# Patient Record
Sex: Female | Born: 1995 | Hispanic: Yes | Marital: Single | Smoking: Former smoker
Health system: Southern US, Community
[De-identification: ages and names within clinical notes are randomized; demographics above are authoritative.]

## PROBLEM LIST (undated history)

## (undated) HISTORY — PX: NO PAST SURGERIES: SHX2092

---

## 2014-10-24 ENCOUNTER — Emergency Department
Admit: 2014-10-24 | Disposition: A | Discharge status: Home / Self Care | Payer: Self-pay | Admitting: Physician Assistant

## 2014-10-25 LAB — BETA STREP CULTURE(ARMC)

## 2015-07-10 NOTE — L&D Delivery Note (Signed)
Delivery Note Patient's last menstrual period was 07/28/2015 (within days).  EDC by US: 06/09/16 EGA: 33.0  At 11:35 AM a viable female was delivered via Vaginal, Spontaneous Delivery (Presentation: direct OP).  APGAR: 7, 8; weight 4 lb 11.1 oz (2129 g).   Placenta status: spontaneous, intact, to pathology.  Cord: 3V, nuchal x1, loose  with the following complications: none.  Cord pH: declined by neonatologist  Anesthesia:  epidural Episiotomy:  none Lacerations:  none Suture Repair: none Est. Blood Loss (mL):  300cc  Mom to postpartum.  Baby to Couplet care / Skin to Skin.  Mom presented with complaints of contractions and was found to be preeclamptic, with elevated blood pressures and protienuria >3g estimated.  She was found to be 3cm with a bulging bag, and had oligohydramnios with an AFI of 1cm.  She was admitted in labor and attempts were made for tocolysis after betamethasone administration x1.  Magnesium sulfate for neuroprotection, and ampicillin for GBS prophylaxis were also administered.  She progressed to complete with a bulging bag of water.  2u of insulin were administered with goal to get blood sugar to <110.  AROM for scant clear fluid was performed, and soon after we began pushing.  2nd stage was <1830mins.  Baby head delivered, nuchal cord reduced, and then body delivered.  The baby was held below the level of the placenta and delayed cord clamping was performed for 60 seconds.  The cord was then doubly clamped and cut by FOB.  Baby was handed off to the neonatologist.  Cord blood was collected.  Placenta delivered spontaneously, intact.  There were no abrasions or lacerations.    For a brief time, baby was placed on mom for skin-to-skin, and we sang happy birthday to this as-of-yet unnamed baby boy.  Jarian Longoria C Westyn Keatley 04/21/2016, 11:59 AM

## 2015-10-26 ENCOUNTER — Emergency Department
Admission: EM | Admit: 2015-10-26 | Discharge: 2015-10-26 | Disposition: A | Discharge status: Home / Self Care | Payer: Self-pay | Attending: Emergency Medicine | Admitting: Emergency Medicine

## 2015-10-26 ENCOUNTER — Emergency Department: Payer: Self-pay

## 2015-10-26 ENCOUNTER — Encounter: Payer: Self-pay | Admitting: Emergency Medicine

## 2015-10-26 DIAGNOSIS — F17210 Nicotine dependence, cigarettes, uncomplicated: Secondary | ICD-10-CM | POA: Insufficient documentation

## 2015-10-26 DIAGNOSIS — O200 Threatened abortion: Secondary | ICD-10-CM | POA: Insufficient documentation

## 2015-10-26 DIAGNOSIS — Z3A12 12 weeks gestation of pregnancy: Secondary | ICD-10-CM | POA: Insufficient documentation

## 2015-10-26 DIAGNOSIS — O99331 Smoking (tobacco) complicating pregnancy, first trimester: Secondary | ICD-10-CM | POA: Insufficient documentation

## 2015-10-26 DIAGNOSIS — R109 Unspecified abdominal pain: Secondary | ICD-10-CM

## 2015-10-26 DIAGNOSIS — O26899 Other specified pregnancy related conditions, unspecified trimester: Secondary | ICD-10-CM

## 2015-10-26 DIAGNOSIS — R1032 Left lower quadrant pain: Secondary | ICD-10-CM | POA: Insufficient documentation

## 2015-10-26 LAB — URINALYSIS COMPLETE WITH MICROSCOPIC (ARMC ONLY)
BACTERIA UA: NONE SEEN
Bilirubin Urine: NEGATIVE
Glucose, UA: NEGATIVE mg/dL
HGB URINE DIPSTICK: NEGATIVE
Ketones, ur: NEGATIVE mg/dL
NITRITE: NEGATIVE
PROTEIN: NEGATIVE mg/dL
Specific Gravity, Urine: 1.003 — ABNORMAL LOW (ref 1.005–1.030)
pH: 8 (ref 5.0–8.0)

## 2015-10-26 LAB — ABO/RH: ABO/RH(D): O POS

## 2015-10-26 LAB — COMPREHENSIVE METABOLIC PANEL WITH GFR
ALT: 28 U/L (ref 14–54)
AST: 27 U/L (ref 15–41)
Albumin: 4.7 g/dL (ref 3.5–5.0)
Alkaline Phosphatase: 80 U/L (ref 38–126)
Anion gap: 7 (ref 5–15)
BUN: 6 mg/dL (ref 6–20)
CO2: 25 mmol/L (ref 22–32)
Calcium: 9.6 mg/dL (ref 8.9–10.3)
Chloride: 104 mmol/L (ref 101–111)
Creatinine, Ser: 0.51 mg/dL (ref 0.44–1.00)
GFR calc Af Amer: >60 mL/min
GFR calc non Af Amer: >60 mL/min
Glucose, Bld: 75 mg/dL (ref 65–99)
Potassium: 3.5 mmol/L (ref 3.5–5.1)
Sodium: 136 mmol/L (ref 135–145)
Total Bilirubin: 0.6 mg/dL (ref 0.3–1.2)
Total Protein: 7.8 g/dL (ref 6.5–8.1)

## 2015-10-26 LAB — WET PREP, GENITAL
Clue Cells Wet Prep HPF POC: NONE SEEN
Sperm: NONE SEEN
Trich, Wet Prep: NONE SEEN
Yeast Wet Prep HPF POC: NONE SEEN

## 2015-10-26 LAB — CBC
HEMATOCRIT: 39.1 % (ref 35.0–47.0)
Hemoglobin: 13.6 g/dL (ref 12.0–16.0)
MCH: 31.3 pg (ref 26.0–34.0)
MCHC: 34.8 g/dL (ref 32.0–36.0)
MCV: 90.1 fL (ref 80.0–100.0)
Platelets: 289 10*3/uL (ref 150–440)
RBC: 4.34 MIL/uL (ref 3.80–5.20)
RDW: 12.7 % (ref 11.5–14.5)
WBC: 8.1 10*3/uL (ref 3.6–11.0)

## 2015-10-26 LAB — LIPASE, BLOOD: Lipase: 21 U/L (ref 11–51)

## 2015-10-26 LAB — CHLAMYDIA/NGC RT PCR (ARMC ONLY)
CHLAMYDIA TR: DETECTED — AB
N gonorrhoeae: NOT DETECTED

## 2015-10-26 LAB — POCT PREGNANCY, URINE: PREG TEST UR: POSITIVE — AB

## 2015-10-26 LAB — TYPE AND SCREEN
ABO/RH(D): O POS
ANTIBODY SCREEN: NEGATIVE

## 2015-10-26 LAB — HCG, QUANTITATIVE, PREGNANCY: hCG, Beta Chain, Quant, S: 93272 m[IU]/mL — ABNORMAL HIGH (ref <5)

## 2015-10-26 LAB — OB RESULTS CONSOLE GC/CHLAMYDIA
CHLAMYDIA, DNA PROBE: POSITIVE
Gonorrhea: NEGATIVE

## 2015-10-26 NOTE — ED Provider Notes (Addendum)
[facility] Emergency Department Provider Note     Time seen: ----------------------------------------- 11:30 AM on 10/26/2015 -----------------------------------------    I have reviewed the triage vital signs and the nursing notes.   HISTORY  Chief Complaint Abdominal Pain and Ectopic Pregnancy    HPI Tara Patterson is a 20 y.o. female who presents to ER for lower abdominal pain and pregnancy. Patient was just seen [facility] the [facility] and sent here for further evaluation as they were concern for ectopic pregnancy. Patient states is her first pregnancy, she's not had any bleeding, pain is starting acutely in the left lower quadrant. He made her pain better or worse.   History reviewed. No pertinent past medical history.  There are no active problems to display for this patient.   History reviewed. No pertinent past surgical history.  Allergies Review of patient's allergies indicates no known allergies.  Social History Social History  Substance Use Topics  . Smoking status: Current Some Day Smoker    Types: Cigarettes  . Smokeless tobacco: None  . Alcohol Use: Yes     Comment: weekly    Review of Systems Constitutional: Negative for fever. Eyes: Negative for visual changes. ENT: Negative for sore throat. Cardiovascular: Negative for chest pain. Respiratory: Negative for shortness of breath. Gastrointestinal: Positive for abdominal pain Genitourinary: Negative for dysuria. Musculoskeletal: Negative for back pain. Skin: Negative for rash. Neurological: Negative for headaches, focal weakness or numbness.  10-point ROS otherwise negative.  ____________________________________________   PHYSICAL EXAM:  VITAL SIGNS: ED Triage Vitals  Enc Vitals Group     BP 10/26/15 1105 130/93 mmHg     Pulse Rate 10/26/15 1105 74     Resp 10/26/15 1105 18     Temp 10/26/15 1105 99.2 F (37.3 C)     Temp Source 10/26/15 1105 Oral      SpO2 10/26/15 1105 100 %     Weight 10/26/15 1105 165 lb (74.844 kg)     Height 10/26/15 1105  (1.676 m)     Head Cir --      Peak Flow --      Pain Score 10/26/15 1106 8     Pain Loc --      Pain Edu? --      Excl. in GC? --     Constitutional: Alert and oriented. Well appearing and in no distress. Eyes: Conjunctivae are normal. PERRL. Normal extraocular movements. ENT   Head: Normocephalic and atraumatic.   Nose: No congestion/rhinnorhea.   Mouth/Throat: Mucous membranes are moist.   Neck: No stridor. Cardiovascular: Normal rate, regular rhythm. No murmurs, rubs, or gallops. Respiratory: Normal respiratory effort without tachypnea nor retractions. Breath sounds are clear and equal bilaterally. No wheezes/rales/rhonchi. Gastrointestinal: Mild left lower quadrant tenderness, no rebound or guarding. Normal bowel sounds. Genitourinary: Is unremarkable, no bleeding or discharge. Cervix is closed Musculoskeletal: Nontender with normal range of motion in all extremities. No lower extremity tenderness nor edema. Neurologic:  Normal speech and language. No gross focal neurologic deficits are appreciated.  Skin:  Skin is warm, dry and intact. No rash noted. Psychiatric: Mood and affect are normal. Speech and behavior are normal.  ____________________________________________  ED COURSE:  Pertinent labs & imaging results that were available during my care of the patient were reviewed by me and considered in my medical decision making (see chart for details). Patient is in no acute distress, will check basic labs, ultrasound imaging ____________________________________________    LABS (pertinent positives/negatives)  Labs Reviewed  HCG, QUANTITATIVE, PREGNANCY - Abnormal; Notable for the following:    hCG, Beta Chain, Quant, S 93272 (*)    All other components within normal limits  URINALYSIS COMPLETEWITH MICROSCOPIC ([facility] ONLY) - Abnormal; Notable for the  following:    Color, Urine COLORLESS (*)    APPearance CLEAR (*)    Specific Gravity, Urine 1.003 (*)    Leukocytes, UA TRACE (*)    Squamous Epithelial / LPF 0-5 (*)    All other components within normal limits  POCT PREGNANCY, URINE - Abnormal; Notable for the following:    Preg Test, Ur POSITIVE (*)    All other components within normal limits  WET PREP, GENITAL  CHLAMYDIA/NGC RT PCR ([facility] ONLY)  LIPASE, BLOOD  COMPREHENSIVE METABOLIC PANEL  CBC  POC URINE PREG, ED  ABO/RH  TYPE AND SCREEN    RADIOLOGY  Pregnancy ultrasound  IMPRESSION: Single living IUP demonstrated. No acute maternal findings visualized. ____________________________________________  FINAL ASSESSMENT AND PLAN  Abdominal pain in pregnancy  Plan: Patient with labs and imaging as dictated above. Patient with reassuring labs and ultrasound. She'll be discharged with OB/GYN referral. No clear etiology for her pain at this time.   Emily FilbertWilliams, Czar Ysaguirre E, MD   Emily FilbertJonathan E Brandey Vandalen, MD 10/26/15 1425  Emily FilbertJonathan E Elford Evilsizer, MD 10/26/15 1432  Emily FilbertJonathan E Donna Snooks, MD 10/26/15 [phone]

## 2015-10-26 NOTE — Discharge Instructions (Signed)

## 2015-10-27 ENCOUNTER — Telehealth: Payer: Self-pay | Admitting: Emergency Medicine

## 2015-10-27 NOTE — ED Notes (Signed)
Called pt to inform of positive chlamydia test and was not treated.  Asked her to call me back.

## 2015-12-09 LAB — OB RESULTS CONSOLE HIV ANTIBODY (ROUTINE TESTING): HIV: NONREACTIVE

## 2015-12-09 LAB — OB RESULTS CONSOLE RPR: RPR: NONREACTIVE

## 2015-12-09 LAB — OB RESULTS CONSOLE HEPATITIS B SURFACE ANTIGEN: Hepatitis B Surface Ag: NEGATIVE

## 2016-01-05 ENCOUNTER — Other Ambulatory Visit: Payer: Self-pay | Admitting: Physician Assistant

## 2016-01-05 DIAGNOSIS — Z3689 Encounter for other specified antenatal screening: Secondary | ICD-10-CM

## 2016-01-06 ENCOUNTER — Ambulatory Visit: Payer: Self-pay

## 2016-01-07 LAB — OB RESULTS CONSOLE GC/CHLAMYDIA
Chlamydia: NEGATIVE
GC PROBE AMP, GENITAL: NEGATIVE

## 2016-01-13 ENCOUNTER — Ambulatory Visit
Admission: RE | Admit: 2016-01-13 | Discharge: 2016-01-13 | Disposition: A | Discharge status: Home / Self Care | Payer: Self-pay | Source: Ambulatory Visit | Attending: Physician Assistant | Admitting: Physician Assistant

## 2016-01-13 DIAGNOSIS — Z3689 Encounter for other specified antenatal screening: Secondary | ICD-10-CM

## 2016-01-13 DIAGNOSIS — Z3A19 19 weeks gestation of pregnancy: Secondary | ICD-10-CM | POA: Insufficient documentation

## 2016-01-13 DIAGNOSIS — O0932 Supervision of pregnancy with insufficient antenatal care, second trimester: Secondary | ICD-10-CM | POA: Insufficient documentation

## 2016-03-17 LAB — OB RESULTS CONSOLE RPR: RPR: NONREACTIVE

## 2016-03-26 ENCOUNTER — Encounter: Payer: Self-pay | Admitting: Dietician

## 2016-03-26 ENCOUNTER — Encounter: Payer: Self-pay | Attending: Advanced Practice Midwife | Admitting: Dietician

## 2016-03-26 VITALS — BP 116/70 | Ht 66.0 in | Wt 186.6 lb

## 2016-03-26 DIAGNOSIS — Z3A00 Weeks of gestation of pregnancy not specified: Secondary | ICD-10-CM | POA: Insufficient documentation

## 2016-03-26 DIAGNOSIS — O24410 Gestational diabetes mellitus in pregnancy, diet controlled: Secondary | ICD-10-CM

## 2016-03-26 DIAGNOSIS — O24419 Gestational diabetes mellitus in pregnancy, unspecified control: Secondary | ICD-10-CM | POA: Insufficient documentation

## 2016-03-26 DIAGNOSIS — Z713 Dietary counseling and surveillance: Secondary | ICD-10-CM | POA: Insufficient documentation

## 2016-03-26 NOTE — Progress Notes (Signed)
Appt. Start Time: 1330 Appt. End Time: 1500  GDM Class 1 Diabetes Overview - define DM; state own type of DM; identify functions of pancreas and insulin; define insulin deficiency vs insulin resistance  Psychosocial - identify DM as a source of stress; state the effects of stress on BG control; verbalize appropriate stress management techniques; identify personal stress issues   Nutritional Management - describe effects of food on blood glucose; identify sources of carbohydrate, protein and fat; verbalize the importance of balance meals in controlling blood glucose; identify meals as well balanced or not; estimate servings of carbohydrate from menus; use food labels to identify servings size, content of carbohydrate, fiber, protein, fat, saturated fat and sodium; recognize food sources of fat, saturated fat, trans fat, sodium and verbalize goals for intake; describe healthful appropriate food choices when dining out   Exercise - describe the effects of exercise on blood glucose and importance of regular exercise in controlling diabetes; state a plan for personal exercise; verbalize contraindications for exercise  Medications - state name, dose, timing of currently prescribed medications; describe types of medications available for diabetes  Insulin Training - prepare and administer insulin accurately; state correct rotation pattern; verbalize safe and lawful needle disposal  Self-Monitoring - state importance of HBGM and demo procedure accurately; use HBGM results to effectively manage diabetes; identify importance of regular HbA1C testing and goals for results  Acute Complications/Sick Day Guidelines - recognize hyperglycemia and hypoglycemia with causes and effects; identify blood glucose results as high, low or in control; list steps in treating and preventing high and low blood glucose; state appropriate measure to manage blood glucose when ill (need for meds, HBGM plan, when to call physician,  need for fluids)  Chronic Complications/Foot, Skin, Eye Dental Care - identify possible long-term complications of diabetes (retinopathy, neuropathy, nephropathy, cardiovascular disease, infections); explain steps in prevention and treatment of chronic complications; state importance of daily self-foot exams; describe how to examine feet and what to look for; explain appropriate eye and dental care  Lifestyle Changes/Goals & Health/Community Resources - state benefits of making appropriate lifestyle changes; identify habits that need to change (meals, tobacco, alcohol); identify strategies to reduce risk factors for personal health; set goals for proper diabetes care; state need for and frequency of healthcare follow-up; describe appropriate community resources for good health (ADA, web sites, apps)   Pregnancy/Sexual Health - define gestational diabetes; state importance of good blood glucose control and birth control prior to pregnancy; state importance of good blood glucose control in preventing sexual problems (impotence, vaginal dryness, infections, loss of desire); state relationship of blood glucose control and pregnancy outcome; describe risk of maternal and fetal complications  Teaching Materials Used: Meter-True Result meter General Meal Planning Guidelines Daily Food Record Gestational Diabetes Booklet Gestational Video Goals for Healthy Pregnancy

## 2016-03-26 NOTE — Patient Instructions (Signed)
Read booklet on Gestational Diabetes Follow Gestational Meal Planning Guidelines Drink 6-7 glasses of water/day Complete a 3 Day Food Record and bring to next appointment Check blood sugars 4 x day - before breakfast and 2 hrs after every meal and record  Bring blood sugar log to all appointments Walk 20-30 minutes at least 5 x week if permitted by MD Call if questions arise [phone] Next appointment    04-02-16

## 2016-04-02 ENCOUNTER — Encounter: Payer: Self-pay | Admitting: Dietician

## 2016-04-02 VITALS — BP 120/76 | Ht 66.0 in | Wt 186.6 lb

## 2016-04-02 DIAGNOSIS — O24410 Gestational diabetes mellitus in pregnancy, diet controlled: Secondary | ICD-10-CM

## 2016-04-02 NOTE — Progress Notes (Signed)
   Patient's BG record indicates FBGs are generally within goal range; post-meal BGs are often above 120, ranging 68-133 with one reading of 153.   Patient's food diary indicates some inconsistent carbohydrate intake, some large starch portions and some sugar-sweetened drinks.    Provided 1900kcal meal plan, and wrote individualized menus based on patient's food preferences. Used food models to illustrate appropriate carbohydrate portions.   Instructed patient on food safety, including avoidance of Listeriosis, and limiting mercury from fish.  Discussed importance of maintaining healthy lifestyle habits to reduce risk of Type 2 DM as well as Gestational DM with any future pregnancies.  Advised patient to use any remaining testing supplies to test some BGs after delivery, and to have BG tested ideally annually, as well as prior to attempting future pregnancies.

## 2016-04-02 NOTE — Patient Instructions (Signed)
   Keep working to eat 2-3 carb servings with your meals (30-45grams), and avoid going over 3 servings.   Eat something every 3-5 hours during the day.   Avoid sugar-sweetened drinks. If you want tea, at least drink 1/2 and 1/2 tea, or sweeten with Splenda or Stevia.   Keep up your regular exercise, you are doing a great job with this!

## 2016-04-20 ENCOUNTER — Inpatient Hospital Stay
Admission: EM | Admit: 2016-04-20 | Discharge: 2016-04-22 | DRG: 775 | Disposition: A | Discharge status: Home / Self Care | Payer: Medicaid Other | Attending: Obstetrics & Gynecology | Admitting: Obstetrics & Gynecology

## 2016-04-20 DIAGNOSIS — O24419 Gestational diabetes mellitus in pregnancy, unspecified control: Secondary | ICD-10-CM | POA: Diagnosis present

## 2016-04-20 DIAGNOSIS — O1493 Unspecified pre-eclampsia, third trimester: Secondary | ICD-10-CM | POA: Diagnosis present

## 2016-04-20 DIAGNOSIS — O139 Gestational [pregnancy-induced] hypertension without significant proteinuria, unspecified trimester: Secondary | ICD-10-CM | POA: Diagnosis present

## 2016-04-20 DIAGNOSIS — O4103X0 Oligohydramnios, third trimester, not applicable or unspecified: Secondary | ICD-10-CM | POA: Diagnosis present

## 2016-04-20 DIAGNOSIS — O1494 Unspecified pre-eclampsia, complicating childbirth: Principal | ICD-10-CM | POA: Diagnosis present

## 2016-04-20 DIAGNOSIS — Z87891 Personal history of nicotine dependence: Secondary | ICD-10-CM

## 2016-04-20 DIAGNOSIS — O0993 Supervision of high risk pregnancy, unspecified, third trimester: Secondary | ICD-10-CM

## 2016-04-20 DIAGNOSIS — O24425 Gestational diabetes mellitus in childbirth, controlled by oral hypoglycemic drugs: Secondary | ICD-10-CM | POA: Diagnosis present

## 2016-04-20 DIAGNOSIS — R030 Elevated blood-pressure reading, without diagnosis of hypertension: Secondary | ICD-10-CM | POA: Diagnosis present

## 2016-04-20 DIAGNOSIS — Z3A32 32 weeks gestation of pregnancy: Secondary | ICD-10-CM | POA: Diagnosis not present

## 2016-04-20 LAB — URINE DRUG SCREEN, QUALITATIVE (ARMC ONLY)
Amphetamines, Ur Screen: NOT DETECTED
BARBITURATES, UR SCREEN: NOT DETECTED
BENZODIAZEPINE, UR SCRN: NOT DETECTED
Cannabinoid 50 Ng, Ur ~~LOC~~: NOT DETECTED
Cocaine Metabolite,Ur ~~LOC~~: NOT DETECTED
MDMA (Ecstasy)Ur Screen: NOT DETECTED
METHADONE SCREEN, URINE: NOT DETECTED
Opiate, Ur Screen: NOT DETECTED
Phencyclidine (PCP) Ur S: NOT DETECTED
TRICYCLIC, UR SCREEN: NOT DETECTED

## 2016-04-20 LAB — CBC
HCT: 33 % — ABNORMAL LOW (ref 35.0–47.0)
Hemoglobin: 11.7 g/dL — ABNORMAL LOW (ref 12.0–16.0)
MCH: 32.6 pg (ref 26.0–34.0)
MCHC: 35.3 g/dL (ref 32.0–36.0)
MCV: 92.2 fL (ref 80.0–100.0)
PLATELETS: 204 10*3/uL (ref 150–440)
RBC: 3.58 MIL/uL — AB (ref 3.80–5.20)
RDW: 13.3 % (ref 11.5–14.5)
WBC: 11.8 10*3/uL — AB (ref 3.6–11.0)

## 2016-04-20 LAB — URINALYSIS COMPLETE WITH MICROSCOPIC (ARMC ONLY)
BACTERIA UA: NONE SEEN
Bilirubin Urine: NEGATIVE
Glucose, UA: NEGATIVE mg/dL
HGB URINE DIPSTICK: NEGATIVE
Ketones, ur: NEGATIVE mg/dL
LEUKOCYTES UA: NEGATIVE
Nitrite: NEGATIVE
PH: 7 (ref 5.0–8.0)
PROTEIN: 100 mg/dL — AB
RBC / HPF: NONE SEEN RBC/hpf (ref 0–5)
Specific Gravity, Urine: 1.01 (ref 1.005–1.030)

## 2016-04-20 LAB — COMPREHENSIVE METABOLIC PANEL
ALT: 13 U/L — AB (ref 14–54)
AST: 20 U/L (ref 15–41)
Albumin: 2.8 g/dL — ABNORMAL LOW (ref 3.5–5.0)
Alkaline Phosphatase: 98 U/L (ref 38–126)
Anion gap: 7 (ref 5–15)
BUN: 11 mg/dL (ref 6–20)
CHLORIDE: 110 mmol/L (ref 101–111)
CO2: 19 mmol/L — ABNORMAL LOW (ref 22–32)
CREATININE: 0.58 mg/dL (ref 0.44–1.00)
Calcium: 8.8 mg/dL — ABNORMAL LOW (ref 8.9–10.3)
Glucose, Bld: 81 mg/dL (ref 65–99)
POTASSIUM: 3.8 mmol/L (ref 3.5–5.1)
Sodium: 136 mmol/L (ref 135–145)
Total Bilirubin: <0.1 mg/dL — ABNORMAL LOW (ref 0.3–1.2)
Total Protein: 6.1 g/dL — ABNORMAL LOW (ref 6.5–8.1)

## 2016-04-20 LAB — TYPE AND SCREEN
ABO/RH(D): O POS
Antibody Screen: NEGATIVE

## 2016-04-20 LAB — FETAL FIBRONECTIN: Fetal Fibronectin: POSITIVE — AB

## 2016-04-20 LAB — PROTEIN / CREATININE RATIO, URINE
Creatinine, Urine: 49 mg/dL
PROTEIN CREATININE RATIO: 2.86 mg/mg{creat} — AB (ref 0.00–0.15)
TOTAL PROTEIN, URINE: 140 mg/dL

## 2016-04-20 LAB — TSH: TSH: 1.533 u[IU]/mL (ref 0.350–4.500)

## 2016-04-20 LAB — RAPID HIV SCREEN (HIV 1/2 AB+AG)
HIV 1/2 Antibodies: NONREACTIVE
HIV-1 P24 ANTIGEN - HIV24: NONREACTIVE

## 2016-04-20 MED ORDER — OXYTOCIN 40 UNITS IN LACTATED RINGERS INFUSION - SIMPLE MED
INTRAVENOUS | Status: AC
  Filled 2016-04-20: qty 1000

## 2016-04-20 MED ORDER — CALCIUM GLUCONATE 10 % IV SOLN
INTRAVENOUS | Status: AC
  Filled 2016-04-20: qty 10

## 2016-04-20 MED ORDER — LACTATED RINGERS IV SOLN
INTRAVENOUS | Status: DC
  Administered 2016-04-20 – 2016-04-21 (×3): via INTRAVENOUS

## 2016-04-20 MED ORDER — BETAMETHASONE SOD PHOS & ACET 6 (3-3) MG/ML IJ SUSP
12.0000 mg | INTRAMUSCULAR | Status: DC
  Administered 2016-04-20: 12 mg via INTRAMUSCULAR
  Filled 2016-04-20 (×2): qty 2

## 2016-04-20 MED ORDER — GLYBURIDE 2.5 MG PO TABS
2.5000 mg | ORAL_TABLET | Freq: Two times a day (BID) | ORAL | Status: DC
  Administered 2016-04-20 – 2016-04-21 (×2): 2.5 mg via ORAL
  Filled 2016-04-20 (×2): qty 1

## 2016-04-20 MED ORDER — MAGNESIUM SULFATE BOLUS VIA INFUSION
6.0000 g | Freq: Once | INTRAVENOUS | Status: AC
  Administered 2016-04-20: 6 g via INTRAVENOUS
  Filled 2016-04-20: qty 500

## 2016-04-20 MED ORDER — AMMONIA AROMATIC IN INHA
RESPIRATORY_TRACT | Status: AC
  Filled 2016-04-20: qty 10

## 2016-04-20 MED ORDER — OXYTOCIN 10 UNIT/ML IJ SOLN
INTRAMUSCULAR | Status: AC
  Filled 2016-04-20: qty 2

## 2016-04-20 MED ORDER — LACTATED RINGERS IV SOLN
1.0000 g/h | INTRAVENOUS | Status: DC
  Administered 2016-04-20: 1 g/h via INTRAVENOUS
  Filled 2016-04-20: qty 80

## 2016-04-20 MED ORDER — MISOPROSTOL 200 MCG PO TABS
ORAL_TABLET | ORAL | Status: DC
Start: 2016-04-20 — End: 2016-04-21
  Filled 2016-04-20: qty 4

## 2016-04-20 MED ORDER — NIFEDIPINE 10 MG PO CAPS
20.0000 mg | ORAL_CAPSULE | Freq: Once | ORAL | Status: AC
  Administered 2016-04-20: 20 mg via ORAL
  Filled 2016-04-20: qty 2

## 2016-04-20 MED ORDER — NIFEDIPINE 10 MG PO CAPS
10.0000 mg | ORAL_CAPSULE | Freq: Four times a day (QID) | ORAL | Status: DC
  Administered 2016-04-21: 10 mg via ORAL
  Filled 2016-04-20: qty 1

## 2016-04-20 MED ORDER — ACETAMINOPHEN 325 MG PO TABS: 650.0000 mg | ORAL_TABLET | ORAL | Status: DC | PRN

## 2016-04-20 MED ORDER — LIDOCAINE HCL (PF) 1 % IJ SOLN
INTRAMUSCULAR | Status: AC
  Filled 2016-04-20: qty 30

## 2016-04-20 NOTE — Plan of Care (Signed)
Pt presents to l/d from [facility]  After being seen by midwife there. Pt states she has elevated blood sugars and protein in urine

## 2016-04-20 NOTE — Plan of Care (Signed)
Pt states she had headaches 2 weeks ago but not today. States she just received order for glyburide today.reflexes 1+. Awaiting orders from dr ward.

## 2016-04-20 NOTE — H&P (Addendum)
OB History & Physical   History of Present Illness:  Chief Complaint: contractions   HPI:  Tara Patterson is a 20 y.o. G1P0 female at 15w6ddated by 7wk ultrasound with EDC of 06/09/16.   She presents to L&D from [facility] due to elevated BPs in the office and complaints of contractions every 5 minutes.  +FM, + CTX, no LOF, no VB  Pregnancy Issues: 1. Late to care 2. +chlamydia 3. Hypertension - recent onset 4. Gestational diabetes - not yet treated, A2 5. Preterm Labor 6. PRENATAL RECORDS UNAVAILABLE  Maternal Medical History:  History reviewed. No pertinent past medical history. denies  History reviewed. No pertinent surgical history.  denies  No Known Allergies  Prior to Admission medications   Medication Sig Start Date End Date Taking? Authorizing Provider  Prenatal Vit-Fe Fumarate-FA (PRENATAL VITAMIN PO) Take 1 tablet by mouth daily.    Historical Provider, MD     [facility]   Social History: She  reports that she has quit smoking. Her smoking use included Cigarettes. She has quit using smokeless tobacco. She reports that she does not drink alcohol.  Family History: family history is not on file.   Review of Systems: A full review of systems was performed and negative except as noted in the HPI.     Physical Exam:  Vital Signs: BP (!) 149/87   Pulse 72   Temp 98.8 F (37.1 C) (Oral)   Resp 18   LMP 07/28/2015 (Within Days)  General: no acute distress.  HEENT: normocephalic, atraumatic Heart: regular rate & rhythm.  No murmurs/rubs/gallops Lungs: clear to auscultation bilaterally, normal respiratory effort Abdomen: soft, gravid, non-tender;  EFW: 3.5lb Pelvic:   External: Normal external female genitalia  Cervix: Dilation: 3 / Effacement (%): 90 /      Extremities: non-tender, symmetric, 0 edema bilaterally.  DTRs: 2+  Neurologic: Alert & oriented x 3.    Results for orders placed or performed during the  hospital encounter of 04/20/16 (from the past 24 hour(s))  Type and screen [place]    Status: None   Collection Time: 04/20/16  6:51 PM  Result Value Ref Range   ABO/RH(D) O POS    Antibody Screen NEG    Sample Expiration 04/23/2016   Fetal fibronectin     Status: Abnormal   Collection Time: 04/20/16  6:51 PM  Result Value Ref Range   Fetal Fibronectin POSITIVE (A) NEGATIVE   Appearance, FETFIB CLEAR CLEAR  Urinalysis complete, with microscopic ([facility] only)     Status: Abnormal   Collection Time: 04/20/16  6:52 PM  Result Value Ref Range   Color, Urine STRAW (A) YELLOW   APPearance CLEAR (A) CLEAR   Glucose, UA NEGATIVE NEGATIVE mg/dL   Bilirubin Urine NEGATIVE NEGATIVE   Ketones, ur NEGATIVE NEGATIVE mg/dL   Specific Gravity, Urine 1.010 1.005 - 1.030   Hgb urine dipstick NEGATIVE NEGATIVE   pH 7.0 5.0 - 8.0   Protein, ur 100 (A) NEGATIVE mg/dL   Nitrite NEGATIVE NEGATIVE   Leukocytes, UA NEGATIVE NEGATIVE   RBC / HPF NONE SEEN 0 - 5 RBC/hpf   WBC, UA 0-5 0 - 5 WBC/hpf   Bacteria, UA NONE SEEN NONE SEEN   Squamous Epithelial / LPF 0-5 (A) NONE SEEN   Mucous PRESENT   Urine Drug Screen, Qualitative ([facility] only)     Status: None   Collection Time: 04/20/16  6:52 PM  Result  Value Ref Range   Tricyclic, Ur Screen NONE DETECTED NONE DETECTED   Amphetamines, Ur Screen NONE DETECTED NONE DETECTED   MDMA (Ecstasy)Ur Screen NONE DETECTED NONE DETECTED   Cocaine Metabolite,Ur [place] NONE DETECTED NONE DETECTED   Opiate, Ur Screen NONE DETECTED NONE DETECTED   Phencyclidine (PCP) Ur S NONE DETECTED NONE DETECTED   Cannabinoid 50 Ng, Ur [place] NONE DETECTED NONE DETECTED   Barbiturates, Ur Screen NONE DETECTED NONE DETECTED   Benzodiazepine, Ur Scrn NONE DETECTED NONE DETECTED   Methadone Scn, Ur NONE DETECTED NONE DETECTED  Protein / creatinine ratio, urine     Status: Abnormal   Collection Time: 04/20/16  6:52 PM  Result Value Ref Range   Creatinine, Urine 49  mg/dL   Total Protein, Urine 140 mg/dL   Protein Creatinine Ratio 2.86 (H) 0.00 - 0.15 mg/mg[Cre]  Comprehensive metabolic panel     Status: Abnormal   Collection Time: 04/20/16  6:56 PM  Result Value Ref Range   Sodium 136 135 - 145 mmol/L   Potassium 3.8 3.5 - 5.1 mmol/L   Chloride 110 101 - 111 mmol/L   CO2 19 (L) 22 - 32 mmol/L   Glucose, Bld 81 65 - 99 mg/dL   BUN 11 6 - 20 mg/dL   Creatinine, Ser 0.58 0.44 - 1.00 mg/dL   Calcium 8.8 (L) 8.9 - 10.3 mg/dL   Total Protein 6.1 (L) 6.5 - 8.1 g/dL   Albumin 2.8 (L) 3.5 - 5.0 g/dL   AST 20 15 - 41 U/L   ALT 13 (L) 14 - 54 U/L   Alkaline Phosphatase 98 38 - 126 U/L   Total Bilirubin <0.1 (L) 0.3 - 1.2 mg/dL   GFR calc non Af Amer >60 >60 mL/min   GFR calc Af Amer >60 >60 mL/min   Anion gap 7 5 - 15  CBC     Status: Abnormal   Collection Time: 04/20/16  6:56 PM  Result Value Ref Range   WBC 11.8 (H) 3.6 - 11.0 K/uL   RBC 3.58 (L) 3.80 - 5.20 MIL/uL   Hemoglobin 11.7 (L) 12.0 - 16.0 g/dL   HCT 33.0 (L) 35.0 - 47.0 %   MCV 92.2 80.0 - 100.0 fL   MCH 32.6 26.0 - 34.0 pg   MCHC 35.3 32.0 - 36.0 g/dL   RDW 13.3 11.5 - 14.5 %   Platelets 204 150 - 440 K/uL  TSH     Status: None   Collection Time: 04/20/16  6:56 PM  Result Value Ref Range   TSH 1.533 0.350 - 4.500 uIU/mL    Pertinent Results:  Prenatal Labs: Blood type/Rh Opos  Antibody screen neg  Rubella Immune  Varicella Immune  RPR NR  HBsAg Neg  HIV NR  GC neg  Chlamydia POS  Genetic screening negative  1 hour GTT elevated  3 hour GTT elevated  GBS Not done   FHT:  150 mod occasional accels, +variable decels TOCO:  Irritable, no pattern discernable SVE:  Dilation: 3 / Effacement (%): 90 /   bulging bag   Cephalic by leopolds  Assessment:  Tara Patterson is a 20 y.o. G1P0 female at 47w6dwith preeclampsia, and preterm labor.   Plan:  1. Admit to Labor & Delivery 2. T&S, Clrs, IVF for a total of 1290mhr 3. GBS unknown, preterm.  Will give ABX  should she progress in labor in spite of tocolysis. Culture sent. 4. Consents obtained. 5. Continuous efm/toco 6. Preeclampsia: continue  protocol vitals.  Not intentionally starting anti-hypertensives at this time, however procardia used for tocolysis and will see if this aids in BP control.  No severe criteria met yet.  24hr urine to be collected while foley in place for magnesium sulfate admin. 7. Preterm labor: betamethasone, procardia 71m load then 141mq6h, and magnesium sulfate for neuroprotection.  While studies confirm use in <32 weeks is of benefit the known benefit between 32-34 weeks is undefined.  There are, however, no studies that show harm, so I will administer 24h.   8. Gestational diabetes - was to be started on glyburide this evening, 2.30m58mID.  Will admin this tonight, although likely to be skewed once BMZ given.  During this time will need to be more aggressive in case of delivery- may need insulin per glucose stabilization protocol.  9. IUP: preterm and category 2 strip with variables.  Will continue to monitor closely.    ----- CheLarey DaysD Attending Obstetrician and Gynecologist [facility]

## 2016-04-20 NOTE — Plan of Care (Signed)
Lab here to draw blood work ordered by dr ward. Urine sent to lab for uds, protein/creatinine ratio and u/a

## 2016-04-21 ENCOUNTER — Encounter: Payer: Self-pay | Admitting: Anesthesiology

## 2016-04-21 ENCOUNTER — Inpatient Hospital Stay: Payer: Medicaid Other | Admitting: Anesthesiology

## 2016-04-21 DIAGNOSIS — O24419 Gestational diabetes mellitus in pregnancy, unspecified control: Secondary | ICD-10-CM | POA: Diagnosis present

## 2016-04-21 LAB — CHLAMYDIA/NGC RT PCR (ARMC ONLY)
Chlamydia Tr: NOT DETECTED
N GONORRHOEAE: NOT DETECTED

## 2016-04-21 LAB — GLUCOSE, CAPILLARY
GLUCOSE-CAPILLARY: 145 mg/dL — AB (ref 65–99)
Glucose-Capillary: 134 mg/dL — ABNORMAL HIGH (ref 65–99)
Glucose-Capillary: 109 mg/dL — ABNORMAL HIGH (ref 65–99)

## 2016-04-21 MED ORDER — DIPHENHYDRAMINE HCL 50 MG/ML IJ SOLN: 12.5000 mg | INTRAMUSCULAR | Status: DC | PRN

## 2016-04-21 MED ORDER — BUTORPHANOL TARTRATE 1 MG/ML IJ SOLN
1.0000 mg | INTRAMUSCULAR | Status: DC | PRN
  Filled 2016-04-21: qty 1

## 2016-04-21 MED ORDER — WITCH HAZEL-GLYCERIN EX PADS: 1.0000 "application " | MEDICATED_PAD | CUTANEOUS | Status: DC | PRN

## 2016-04-21 MED ORDER — FENTANYL 2.5 MCG/ML W/ROPIVACAINE 0.2% IN NS 100 ML EPIDURAL INFUSION (ARMC-ANES)
10.0000 mL/h | EPIDURAL | Status: DC
  Administered 2016-04-21: 10 mL/h via EPIDURAL
  Filled 2016-04-21: qty 100

## 2016-04-21 MED ORDER — IBUPROFEN 600 MG PO TABS
600.0000 mg | ORAL_TABLET | Freq: Four times a day (QID) | ORAL | Status: DC
  Administered 2016-04-21 – 2016-04-22 (×4): 600 mg via ORAL
  Filled 2016-04-21 (×4): qty 1

## 2016-04-21 MED ORDER — ONDANSETRON HCL 4 MG PO TABS: 4.0000 mg | ORAL_TABLET | ORAL | Status: DC | PRN

## 2016-04-21 MED ORDER — DIBUCAINE 1 % RE OINT: 1.0000 "application " | TOPICAL_OINTMENT | RECTAL | Status: DC | PRN

## 2016-04-21 MED ORDER — DIPHENHYDRAMINE HCL 25 MG PO CAPS
25.0000 mg | ORAL_CAPSULE | Freq: Four times a day (QID) | ORAL | Status: DC | PRN
Start: 2016-04-21 — End: 2016-04-22

## 2016-04-21 MED ORDER — TETANUS-DIPHTH-ACELL PERTUSSIS 5-2.5-18.5 LF-MCG/0.5 IM SUSP: 0.5000 mL | Freq: Once | INTRAMUSCULAR | Status: DC

## 2016-04-21 MED ORDER — EPHEDRINE 5 MG/ML INJ
10.0000 mg | INTRAVENOUS | Status: DC | PRN
  Filled 2016-04-21: qty 2

## 2016-04-21 MED ORDER — ONDANSETRON HCL 4 MG/2ML IJ SOLN: 4.0000 mg | INTRAMUSCULAR | Status: DC | PRN

## 2016-04-21 MED ORDER — ACETAMINOPHEN 500 MG PO TABS: 1000.0000 mg | ORAL_TABLET | Freq: Four times a day (QID) | ORAL | Status: DC | PRN

## 2016-04-21 MED ORDER — FENTANYL 2.5 MCG/ML W/ROPIVACAINE 0.2% IN NS 100 ML EPIDURAL INFUSION (ARMC-ANES)
EPIDURAL | Status: DC | PRN
  Administered 2016-04-21: 10 mL/h via EPIDURAL

## 2016-04-21 MED ORDER — BUPIVACAINE HCL (PF) 0.25 % IJ SOLN
INTRAMUSCULAR | Status: DC | PRN
  Administered 2016-04-21: 5 mL via EPIDURAL

## 2016-04-21 MED ORDER — SODIUM CHLORIDE 0.9 % IV SOLN
2.0000 g | Freq: Once | INTRAVENOUS | Status: AC
  Administered 2016-04-21: 2 g via INTRAVENOUS

## 2016-04-21 MED ORDER — LACTATED RINGERS IV SOLN: 500.0000 mL | Freq: Once | INTRAVENOUS | Status: DC

## 2016-04-21 MED ORDER — FENTANYL 2.5 MCG/ML W/ROPIVACAINE 0.2% IN NS 100 ML EPIDURAL INFUSION (ARMC-ANES)
EPIDURAL | Status: AC
  Filled 2016-04-21: qty 100

## 2016-04-21 MED ORDER — SIMETHICONE 80 MG PO CHEW: 80.0000 mg | CHEWABLE_TABLET | ORAL | Status: DC | PRN

## 2016-04-21 MED ORDER — PRENATAL MULTIVITAMIN CH
1.0000 | ORAL_TABLET | Freq: Every day | ORAL | Status: DC
  Administered 2016-04-22: 1 via ORAL
  Filled 2016-04-21: qty 1

## 2016-04-21 MED ORDER — PHENYLEPHRINE 40 MCG/ML (10ML) SYRINGE FOR IV PUSH (FOR BLOOD PRESSURE SUPPORT)
80.0000 ug | PREFILLED_SYRINGE | INTRAVENOUS | Status: DC | PRN
  Filled 2016-04-21: qty 5

## 2016-04-21 MED ORDER — COCONUT OIL OIL: 1.0000 "application " | TOPICAL_OIL | Status: DC | PRN

## 2016-04-21 MED ORDER — INFLUENZA VAC SPLIT QUAD 0.5 ML IM SUSY: 0.5000 mL | PREFILLED_SYRINGE | INTRAMUSCULAR | Status: DC | PRN

## 2016-04-21 MED ORDER — DOCUSATE SODIUM 100 MG PO CAPS
100.0000 mg | ORAL_CAPSULE | Freq: Two times a day (BID) | ORAL | Status: DC
  Administered 2016-04-21 – 2016-04-22 (×2): 100 mg via ORAL
  Filled 2016-04-21 (×2): qty 1

## 2016-04-21 MED ORDER — SODIUM CHLORIDE 0.9 % IV SOLN
INTRAVENOUS | Status: AC
  Filled 2016-04-21: qty 2000

## 2016-04-21 MED ORDER — INSULIN ASPART 100 UNIT/ML ~~LOC~~ SOLN
2.0000 [IU] | Freq: Once | SUBCUTANEOUS | Status: AC
  Administered 2016-04-21: 2 [IU] via SUBCUTANEOUS
  Filled 2016-04-21: qty 2
  Filled 2016-04-21: qty 0.02

## 2016-04-21 NOTE — Procedures (Signed)
Bedside ultrasound:  Fetal lie: longitudinal Presentation: Cephalic  Q1: 0cm Q2: 1cm Q3: 0cm Q4: 0cm  Interpretation: AFI:  1cm in maternal upper left quadrant.  NO OTHER DISCERNABLE AREAS OF FLUID Placenta right lateral  ----- Ranae Plumberhelsea Shianne Zeiser, MD Attending Obstetrician and Gynecologist [facility], Department of OB/GYN [facility]

## 2016-04-21 NOTE — Discharge Summary (Signed)
Obstetrical Discharge Summary  Patient Name: Tara Kosnahi Valencia Pichardo DOB: 12/16/1995 MRN: [id number]  Date of Admission: 04/20/2016 Date of Discharge: 04/22/2016  Primary OB:  [facility]  Gestational Age at Delivery: 6329w0d   Antepartum complications: Preeclampsia, Gestational Diabetes, chlamydia in pregnancy, preterm labor Admitting Diagnosis:  Preeclampsia, preterm labor Secondary Diagnosis: Patient Active Problem List   Diagnosis Date Noted  . Preeclampsia, third trimester 04/20/2016    Priority: High  . Preterm labor in third trimester without delivery 04/21/2016  . Gestational diabetes 04/21/2016  . Preterm delivery 04/21/2016  . Supervision of high risk pregnancy in third trimester 04/20/2016  . Preterm labor 04/20/2016    Augmentation: AROM Complications: None Intrapartum complications/course: Mom was sent from the clinic with complaints of contractions and was found to be preeclamptic, with elevated blood pressures (mild range) and protienuria >3g estimated.  She was found to be 3cm with a bulging bag, and had oligohydramnios with an AFI of 1cm.  She was admitted in labor and attempts were made for tocolysis after betamethasone administration x1.  Magnesium sulfate for neuroprotection, and ampicillin for GBS prophylaxis were also administered.  She progressed to complete with a bulging bag of water.  2u of insulin were administered achieve blood sugars of <110.  AROM for scant clear fluid was performed, and soon after we began pushing.  2nd stage was <2230mins.  Baby head delivered, nuchal cord reduced, and then body delivered.  The baby was held below the level of the placenta and delayed cord clamping was performed for 60 seconds.  The cord was then doubly clamped and cut by FOB.  Baby was handed off to the neonatologist.  Cord blood was collected.  Placenta delivered spontaneously, intact.     GBS status still unknown on day of discharge  Date of Delivery: 04/21/16 Delivered By:  Leeroy Bockhelsea Shail Urbas Delivery Type: spontaneous vaginal delivery Anesthesia: epidural Placenta: sponatneous Laceration: none Episiotomy: none Newborn Data: Live born female  Birth Weight: 4 lb 11.1 oz (2129 g) APGAR: 7, 8    Discharge Physical Exam:  BP 123/70   Pulse (!) 114   Temp 98.4 F (36.9 C) (Oral)   Resp 20   Ht 5\' 6"  (1.676 m)   Wt 88.5 kg (195 lb)   LMP 07/28/2015 (Within Days)   SpO2 97%   BMI 31.47 kg/m   General: NAD CV: RRR Pulm: CTABL, nl effort ABD: s/nd/nt, fundus firm and below the umbilicus Lochia: moderate DVT Evaluation: LE non-ttp, no evidence of DVT on exam.  Hemoglobin  Date Value Ref Range Status  04/20/2016 11.7 (L) 12.0 - 16.0 g/dL Final   HCT  Date Value Ref Range Status  04/20/2016 33.0 (L) 35.0 - 47.0 % Final    Post partum course: Uncomplicated. By postpartum day #1 she was tolerating a regular diet, her blood pressures were normal and her pain was controlled with po meds. Postpartum Procedures: none Disposition: stable, discharge to home. Baby Feeding: breastmilk Baby Disposition: NICU  Rh Immune globulin given: no Rubella vaccine given: no Tdap vaccine given in AP or PP setting: postpartum Flu vaccine given in AP or PP setting: antepartum  Contraception: TBD, depo at discharge initially planned, but she wanted Nexplanon at her postpartum visit.  Prenatal Labs:  Blood type/Rh Opos  Antibody screen neg  Rubella Immune  Varicella Immune  RPR NR  HBsAg Neg  HIV NR  GC neg  Chlamydia POS  Genetic screening negative  1 hour GTT elevated  3 hour GTT elevated  GBS Not done      Plan:  Tara Patterson was discharged to home in good condition. [facility] appointment at [facility] in 2 weeks    Signed: Christeen Douglas, MPH, MD [facility]  Co-Signed: Ranae Plumber, MD Attending Obstetrician and [facility], Department of OB/GYN [facility]
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
## 2016-04-21 NOTE — OB Triage Note (Signed)
BS 109

## 2016-04-21 NOTE — Lactation Note (Signed)
This note was copied from a baby's chart. Lactation Consultation Note  Patient Name: Tara Patterson Today's Date: 04/21/2016     Maternal Data  Set up symphony electric breast pump with instruction in use and care, mom pumped x 20 min. And obtained a small amt of drops of colostrum, instructed to pump breasts every 3 hrs and obtain assist as needed from her nurse overnight., [facility] notify of pt's need for electric breast pump at home.   Feeding    LATCH Score/Interventions                      Lactation Tools Discussed/Used     Consult Status      Tara Patterson 04/21/2016, 6:59 PM

## 2016-04-21 NOTE — Anesthesia Procedure Notes (Signed)
Epidural Patient location during procedure: OB  Staffing Anesthesiologist: Berdine AddisonHOMAS, Tara Patterson Performed: anesthesiologist   Preanesthetic Checklist Completed: patient identified, site marked, surgical consent, pre-op evaluation, timeout performed, IV checked, risks and benefits discussed and monitors and equipment checked  Epidural Patient position: sitting Prep: Betadine Patient monitoring: heart rate, continuous pulse ox and blood pressure Approach: midline Location: L4-L5 Injection technique: LOR saline  Needle:  Needle type: Tuohy  Needle gauge: 18 G Needle length: 9 cm and 9 Catheter type: closed end flexible Catheter size: 20 Guage Test dose: negative and 1.5% lidocaine with Epi 1:200 K  Assessment Sensory level: T10 Events: blood not aspirated, injection not painful, no injection resistance, negative IV test and no paresthesia  Additional Notes   Patient tolerated the insertion well without complications. Catheter in 0204. Test 0205. Bolus [id number]. Infusion 0215.Reason for block:procedure for pain

## 2016-04-21 NOTE — OB Triage Note (Signed)
BS 134

## 2016-04-21 NOTE — Progress Notes (Signed)
Intrapartum update  SVE: 9.5 BS now 134  Spoke to neonatology- would prefer if we can administer insulin and decrease mom's BG, as long as fetus is stable.    Ordered 2u aspart, will recheck 30mins after admin if possible.  ----- Ranae Plumberhelsea Ward, MD Attending Obstetrician and Gynecologist [facility], Department of OB/GYN [facility]

## 2016-04-21 NOTE — Progress Notes (Addendum)
Intrapartum progress note:  S: comfortable after epidural Denies: HA, visual changes, SOB, or RUQ/epigastric pain   O: BP 123/70   Pulse (!) 114   Temp 98.4 F (36.9 C) (Oral)   Resp 18   Ht 5\' 6"  (1.676 m)   Wt 88.5 kg (195 lb)   LMP 07/28/2015 (Within Days)   SpO2 97%   BMI 31.47 kg/m   Blood sugar, fasting 140  FHT: 140 Mod no accels +variables with spontaneous return Toco: q814mins SVE: bloody show, bulging bag, 9.5/100/0   A/P: 20yo G1 @ 33.0 with oligohydramnios, preeclampsia, gestational diabetes, and preterm labor.  1. PTL: true.  No further efforts for tocolysis 2. Preeclampsia: BP response to procardia given for tocolysis, currently WNL with occasional mild-ranges.  No signs or symptoms of worsening preeclampsia 3. GDMA2: fasting BS this AM was 140, after admin of BMZ.  Had started glyburide last night; with anticipated delivery will admin insulin for immediate reduction to reduce possible effects on newborn. 4. IUP: Category 2, with variables due to oligo, on magnesium sulfate for neuroprotection, s/p BMZ x1 at 21:55.  If possible will give 2nd dose @ 12hrs (09:55) if not delivered by then.  Ampicillin given for GBS unknown. 5. NICU team spoke to patient and partner overnight  ----- Ranae Plumberhelsea Ward, MD Attending Obstetrician and Gynecologist [facility], Department of OB/GYN [facility]

## 2016-04-22 LAB — CBC
HCT: 27.8 % — ABNORMAL LOW (ref 35.0–47.0)
Hemoglobin: 9.5 g/dL — ABNORMAL LOW (ref 12.0–16.0)
MCH: 32.3 pg (ref 26.0–34.0)
MCHC: 34.2 g/dL (ref 32.0–36.0)
MCV: 94.3 fL (ref 80.0–100.0)
PLATELETS: 179 10*3/uL (ref 150–440)
RBC: 2.94 MIL/uL — ABNORMAL LOW (ref 3.80–5.20)
RDW: 13.6 % (ref 11.5–14.5)
WBC: 14.9 10*3/uL — AB (ref 3.6–11.0)

## 2016-04-22 LAB — RPR: RPR Ser Ql: NONREACTIVE

## 2016-04-22 MED ORDER — OXYCODONE-ACETAMINOPHEN 5-325 MG PO TABS: 1.0000 | ORAL_TABLET | Freq: Four times a day (QID) | ORAL | 0 refills | Status: DC | PRN

## 2016-04-22 MED ORDER — DOCUSATE SODIUM 100 MG PO CAPS: 100.0000 mg | ORAL_CAPSULE | Freq: Every day | ORAL | 3 refills | Status: AC | PRN

## 2016-04-22 MED ORDER — MEDROXYPROGESTERONE ACETATE 150 MG/ML IM SUSP
150.0000 mg | Freq: Once | INTRAMUSCULAR | Status: DC
  Filled 2016-04-22: qty 1

## 2016-04-22 MED ORDER — IBUPROFEN 800 MG PO TABS: 800.0000 mg | ORAL_TABLET | Freq: Three times a day (TID) | ORAL | 0 refills | Status: AC | PRN

## 2016-04-22 NOTE — Anesthesia Postprocedure Evaluation (Signed)
Anesthesia Post Note  Patient: Tara Patterson  Procedure(s) Performed: * No procedures listed *  Patient location during evaluation: Mother Baby Anesthesia Type: Epidural Level of consciousness: awake and alert Pain management: pain level controlled Vital Signs Assessment: post-procedure vital signs reviewed and stable Respiratory status: spontaneous breathing, nonlabored ventilation and respiratory function stable Cardiovascular status: stable Postop Assessment: no headache and no backache Anesthetic complications: no    Last Vitals:  Vitals:   04/22/16 0338 04/22/16 0800  BP: 118/78 128/84  Pulse: 80 65  Resp: 18 20  Temp: 36.6 C 36.9 C    Last Pain:  Vitals:   04/22/16 0800  TempSrc: Oral  PainSc:                  [name]

## 2016-04-22 NOTE — Discharge Instructions (Signed)
Discharge instructions:   Call office if you have any of the following: headache, visual changes, fever >101.0 F, chills, breast concerns, excessive vaginal bleeding, incision drainage or problems, leg pain or redness, depression or any other concerns.   Activity: Do not lift > 10 lbs for 6 weeks.  No intercourse or tampons for 6 weeks.  No driving for 1-2 weeks.   Call your doctor for increased pain or vaginal bleeding, temperature above 101.0, depression, or concerns.  No strenuous activity or heavy lifting for 6 weeks.  No intercourse, tampons, douching, or enemas for 6 weeks.  No tub baths-showers only.  No driving for 2 weeks or while taking pain medications.  Continue prenatal vitamin and iron.  Increase calories and fluids while breastfeeding. Postpartum Care After Vaginal Delivery After you deliver your newborn (postpartum period), the usual stay in the hospital is 24-72 hours. If there were problems with your labor or delivery, or if you have other medical problems, you might be in the hospital longer.  While you are in the hospital, you will receive help and instructions on how to care for yourself and your newborn during the postpartum period.  While you are in the hospital:  Be sure to tell your nurses if you have pain or discomfort, as well as where you feel the pain and what makes the pain worse.  If you had an incision made near your vagina (episiotomy) or if you had some tearing during delivery, the nurses may put ice packs on your episiotomy or tear. The ice packs may help to reduce the pain and swelling.  If you are breastfeeding, you may feel uncomfortable contractions of your uterus for a couple of weeks. This is normal. The contractions help your uterus get back to normal size.  It is normal to have some bleeding after delivery.  For the first 1-3 days after delivery, the flow is red and the amount may be similar to a period.  It is common for the flow to start and  stop.  In the first few days, you may pass some small clots. Let your nurses know if you begin to pass large clots or your flow increases.  Do not  flush blood clots down the toilet before having the nurse look at them.  During the next 3-10 days after delivery, your flow should become more watery and pink or brown-tinged in color.  Ten to fourteen days after delivery, your flow should be a small amount of yellowish-white discharge.  The amount of your flow will decrease over the first few weeks after delivery. Your flow may stop in 6-8 weeks. Most women have had their flow stop by 12 weeks after delivery.  You should change your sanitary pads frequently.  Wash your hands thoroughly with soap and water for at least 20 seconds after changing pads, using the toilet, or before holding or feeding your newborn.  You should feel like you need to empty your bladder within the first 6-8 hours after delivery.  In case you become weak, lightheaded, or faint, call your nurse before you get out of bed for the first time and before you take a shower for the first time.  Within the first few days after delivery, your breasts may begin to feel tender and full. This is called engorgement. Breast tenderness usually goes away within 48-72 hours after engorgement occurs. You may also notice milk leaking from your breasts. If you are not breastfeeding, do not stimulate your breasts.  Breast stimulation can make your breasts produce more milk.  Spending as much time as possible with your newborn is very important. During this time, you and your newborn can feel close and get to know each other. Having your newborn stay in your room (rooming in) will help to strengthen the bond with your newborn. It will give you time to get to know your newborn and become comfortable caring for your newborn.  Your hormones change after delivery. Sometimes the hormone changes can temporarily cause you to feel sad or tearful. These  feelings should not last more than a few days. If these feelings last longer than that, you should talk to your caregiver.  If desired, talk to your caregiver about methods of family planning or contraception.  Talk to your caregiver about immunizations. Your caregiver may want you to have the following immunizations before leaving the hospital:  Tetanus, diphtheria, and pertussis (Tdap) or tetanus and diphtheria (Td) immunization. It is very important that you and your family (including grandparents) or others caring for your newborn are up-to-date with the Tdap or Td immunizations. The Tdap or Td immunization can help protect your newborn from getting ill.  Rubella immunization.  Varicella (chickenpox) immunization.  Influenza immunization. You should receive this annual immunization if you did not receive the immunization during your pregnancy.   This information is not intended to replace advice given to you by your health care provider. Make sure you discuss any questions you have with your health care provider.   Document Released: 04/22/2007 Document Revised: 03/19/2012 Document Reviewed: 02/20/2012 Elsevier Interactive Patient Education [employer]2016 Elsevier Inc.   You may have a slight fever when your milk comes in, but it should go away on its own.  If it does not, and rises above 101.0 please call the doctor.  For concerns about your baby, please call your pediatrician For breastfeeding concerns, the lactation consultant can be reached at [phone]  Acetaminophen; Oxycodone tablets What is this medicine? ACETAMINOPHEN; OXYCODONE (a set a MEE noe fen; ox i KOE done) is a pain reliever. It is used to treat moderate to severe pain. This medicine may be used for other purposes; ask your health care provider or pharmacist if you have questions. What should I tell my health care provider before I take this medicine? They need to know if you have any of these conditions: -brain  tumor -Crohn's disease, inflammatory bowel disease, or ulcerative colitis -drug abuse or addiction -head injury -heart or circulation problems -if you often drink alcohol -kidney disease or problems going to the bathroom -liver disease -lung disease, asthma, or breathing problems -an unusual or allergic reaction to acetaminophen, oxycodone, other opioid analgesics, other medicines, foods, dyes, or preservatives -pregnant or trying to get pregnant -breast-feeding How should I use this medicine? Take this medicine by mouth with a full glass of water. Follow the directions on the prescription label. You can take it with or without food. If it upsets your stomach, take it with food. Take your medicine at regular intervals. Do not take it more often than directed. Talk to your pediatrician regarding the use of this medicine in children. Special care may be needed. Patients over 21 years old may have a stronger reaction and need a smaller dose. Overdosage: If you think you have taken too much of this medicine contact a poison control center or emergency room at once. NOTE: This medicine is only for you. Do not share this medicine with others. What if I  miss a dose? If you miss a dose, take it as soon as you can. If it is almost time for your next dose, take only that dose. Do not take double or extra doses. What may interact with this medicine? -alcohol -antihistamines -barbiturates like amobarbital, butalbital, butabarbital, methohexital, pentobarbital, phenobarbital, thiopental, and secobarbital -benztropine -drugs for bladder problems like solifenacin, trospium, oxybutynin, tolterodine, hyoscyamine, and methscopolamine -drugs for breathing problems like ipratropium and tiotropium -drugs for certain stomach or intestine problems like propantheline, homatropine methylbromide, glycopyrrolate, atropine, belladonna, and dicyclomine -general anesthetics like etomidate, ketamine, nitrous oxide,  propofol, desflurane, enflurane, halothane, isoflurane, and sevoflurane -medicines for depression, anxiety, or psychotic disturbances -medicines for sleep -muscle relaxants -naltrexone -narcotic medicines (opiates) for pain -phenothiazines like perphenazine, thioridazine, chlorpromazine, mesoridazine, fluphenazine, prochlorperazine, promazine, and trifluoperazine -scopolamine -tramadol -trihexyphenidyl This list may not describe all possible interactions. Give your health care provider a list of all the medicines, herbs, non-prescription drugs, or dietary supplements you use. Also tell them if you smoke, drink alcohol, or use illegal drugs. Some items may interact with your medicine. What should I watch for while using this medicine? Tell your doctor or health care professional if your pain does not go away, if it gets worse, or if you have new or a different type of pain. You may develop tolerance to the medicine. Tolerance means that you will need a higher dose of the medication for pain relief. Tolerance is normal and is expected if you take this medicine for a long time. Do not suddenly stop taking your medicine because you may develop a severe reaction. Your body becomes used to the medicine. This does NOT mean you are addicted. Addiction is a behavior related to getting and using a drug for a non-medical reason. If you have pain, you have a medical reason to take pain medicine. Your doctor will tell you how much medicine to take. If your doctor wants you to stop the medicine, the dose will be slowly lowered over time to avoid any side effects. You may get drowsy or dizzy. Do not drive, use machinery, or do anything that needs mental alertness until you know how this medicine affects you. Do not stand or sit up quickly, especially if you are an older patient. This reduces the risk of dizzy or fainting spells. Alcohol may interfere with the effect of this medicine. Avoid alcoholic drinks. There  are different types of narcotic medicines (opiates) for pain. If you take more than one type at the same time, you may have more side effects. Give your health care provider a list of all medicines you use. Your doctor will tell you how much medicine to take. Do not take more medicine than directed. Call emergency for help if you have problems breathing. The medicine will cause constipation. Try to have a bowel movement at least every 2 to 3 days. If you do not have a bowel movement for 3 days, call your doctor or health care professional. Do not take Tylenol (acetaminophen) or medicines that have acetaminophen with this medicine. Too much acetaminophen can be very dangerous. Many nonprescription medicines contain acetaminophen. Always read the labels carefully to avoid taking more acetaminophen. What side effects may I notice from receiving this medicine? Side effects that you should report to your doctor or health care professional as soon as possible: -allergic reactions like skin rash, itching or hives, swelling of the face, lips, or tongue -breathing difficulties, wheezing -confusion -light headedness or fainting spells -severe stomach pain -unusually  weak or tired -yellowing of the skin or the whites of the eyes Side effects that usually do not require medical attention (report to your doctor or health care professional if they continue or are bothersome): -dizziness -drowsiness -nausea -vomiting This list may not describe all possible side effects. Call your doctor for medical advice about side effects. You may report side effects to FDA at 1-800-FDA-1088. Where should I keep my medicine? Keep out of the reach of children. This medicine can be abused. Keep your medicine in a safe place to protect it from theft. Do not share this medicine with anyone. Selling or giving away this medicine is dangerous and against the law. This medicine may cause accidental overdose and death if it taken by  other adults, children, or pets. Mix any unused medicine with a substance like cat litter or coffee grounds. Then throw the medicine away in a sealed container like a sealed bag or a coffee can with a lid. Do not use the medicine after the expiration date. Store at room temperature between 20 and 25 degrees C (68 and 77 degrees F). NOTE: This sheet is a summary. It may not cover all possible information. If you have questions about this medicine, talk to your doctor, pharmacist, or health care provider.    2016, Elsevier/Gold Standard. (2014-05-26 15:18:46)

## 2016-04-22 NOTE — Progress Notes (Signed)
Discharge Instructions reviewed with patient and significant other with patients permission. Patient verbalized understanding. Patient aware [facility] for post [facility]um Check up. Rx given to patient for Colace, Ibuprofen and percocet. Sedative warning regarding Percocet, reviewed with patient. Vital signs stable, patient denies any pain, NO IV catheter or epidural catheter present this shift. Patient escorted to lobby via wheelchair with nurse, belongings including breast pump and significant other. Significant other driving home.

## 2016-04-23 LAB — CULTURE, BETA STREP (GROUP B ONLY)

## 2016-04-24 LAB — SURGICAL PATHOLOGY

## 2016-04-26 ENCOUNTER — Ambulatory Visit: Payer: Self-pay

## 2016-04-26 NOTE — Lactation Note (Signed)
This note was copied from a baby's chart. Lactation Consultation Note  Patient Name: Tara Patterson Today's Date: 04/26/2016     Maternal Data    Feeding Feeding Type: Breast Milk Length of feed: 30 min  LATCH Score/Interventions                      Lactation Tools Discussed/Used     Consult Status   F/U with mom to see if flanges need to be bumped up to 27mm over the weekend.    Burnadette PeterJaniya M Azhar Knope 04/26/2016, 3:23 PM

## 2016-05-09 ENCOUNTER — Ambulatory Visit: Payer: Self-pay

## 2016-05-09 NOTE — Lactation Note (Signed)
This note was copied from a baby's chart. Lactation Consultation Note  Patient Name: Tara Patterson [id number]'s Date: 05/09/2016     Maternal Data  Baby going home today. Mom has been pumping and bottle feeding with a couple breastfeeding attempts. She states she used to get 2 oz per breast with pumping, but now only gets 2 oz total. She states she pumps every 3 hours, but then admits she goes all night without any pumping. I suggested she stick to the current feeding plan of pump and bottle feed, but now that she is home, perhaps she can find more time to add an additional 2-3 more pumpings into each 24 hours to boost her supply. I also encouraged lots of skin to skin time and nuzzle at breast. If she want baby to learn to breastfeed, I encouraged her to boost her supply first this week and then set up [facility] consult to we can help guide them in their breastfeedign journey. I gave her our contact info; [employer] info; storage guidelines of breastmilk magnet and BF booklet.   Feeding Feeding Type: Bottle Fed - Breast Milk Nipple Type: Slow - flow Length of feed: 30 min  LATCH Score/Interventions                      Lactation Tools Discussed/Used Tools: Bottle   Consult Status      Tara Patterson 05/09/2016, 10:16 AM

## 2019-06-05 ENCOUNTER — Other Ambulatory Visit: Payer: Self-pay

## 2019-06-05 ENCOUNTER — Emergency Department: Payer: No Typology Code available for payment source

## 2019-06-05 DIAGNOSIS — R519 Headache, unspecified: Secondary | ICD-10-CM | POA: Insufficient documentation

## 2019-06-05 DIAGNOSIS — M791 Myalgia, unspecified site: Secondary | ICD-10-CM | POA: Diagnosis not present

## 2019-06-05 DIAGNOSIS — M546 Pain in thoracic spine: Secondary | ICD-10-CM | POA: Insufficient documentation

## 2019-06-05 DIAGNOSIS — Z87891 Personal history of nicotine dependence: Secondary | ICD-10-CM | POA: Diagnosis not present

## 2019-06-05 DIAGNOSIS — M542 Cervicalgia: Secondary | ICD-10-CM | POA: Diagnosis not present

## 2019-06-05 DIAGNOSIS — M25512 Pain in left shoulder: Secondary | ICD-10-CM | POA: Diagnosis present
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
## 2019-06-06 ENCOUNTER — Emergency Department
Admission: EM | Admit: 2019-06-06 | Discharge: 2019-06-06 | Disposition: A | Discharge status: Home / Self Care | Payer: No Typology Code available for payment source | Attending: Emergency Medicine | Admitting: Emergency Medicine

## 2019-06-06 DIAGNOSIS — M7918 Myalgia, other site: Secondary | ICD-10-CM

## 2019-06-06 MED ORDER — CYCLOBENZAPRINE HCL 10 MG PO TABS: 10.0000 mg | ORAL_TABLET | Freq: Three times a day (TID) | ORAL | 0 refills | Status: DC | PRN

## 2019-06-06 MED ORDER — CYCLOBENZAPRINE HCL 10 MG PO TABS
10.0000 mg | ORAL_TABLET | Freq: Once | ORAL | Status: AC
  Administered 2019-06-06: 02:00:00 10 mg via ORAL
  Filled 2019-06-06: qty 1
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
## 2019-06-06 NOTE — ED Notes (Signed)
EDP Brown at bedside  
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
## 2019-06-26 ENCOUNTER — Other Ambulatory Visit: Payer: Self-pay

## 2019-06-26 ENCOUNTER — Ambulatory Visit
Admission: EM | Admit: 2019-06-26 | Discharge: 2019-06-26 | Disposition: A | Discharge status: Home / Self Care | Payer: Self-pay | Attending: Family Medicine | Admitting: Family Medicine

## 2019-06-26 ENCOUNTER — Ambulatory Visit (INDEPENDENT_AMBULATORY_CARE_PROVIDER_SITE_OTHER): Payer: Self-pay

## 2019-06-26 DIAGNOSIS — M545 Low back pain: Secondary | ICD-10-CM

## 2019-06-26 DIAGNOSIS — M542 Cervicalgia: Secondary | ICD-10-CM

## 2019-06-26 DIAGNOSIS — S39012A Strain of muscle, fascia and tendon of lower back, initial encounter: Secondary | ICD-10-CM

## 2019-06-26 DIAGNOSIS — S161XXA Strain of muscle, fascia and tendon at neck level, initial encounter: Secondary | ICD-10-CM

## 2019-06-26 MED ORDER — CYCLOBENZAPRINE HCL 10 MG PO TABS: 10.0000 mg | ORAL_TABLET | Freq: Every day | ORAL | 0 refills | Status: DC

## 2019-06-26 MED ORDER — MELOXICAM 15 MG PO TABS: 15.0000 mg | ORAL_TABLET | Freq: Every day | ORAL | 0 refills | Status: DC
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
## 2019-06-26 NOTE — Discharge Instructions (Signed)
Over the counter tylenol as needed °
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
## 2021-04-01 IMAGING — CR DG SHOULDER 2+V*L*
2 series · 2 of 2 positions shown · non-contrast
Comparison: None.

CLINICAL DATA: Pain

EXAM:
LEFT SHOULDER - 2+ VIEW

[shoulder grashey]
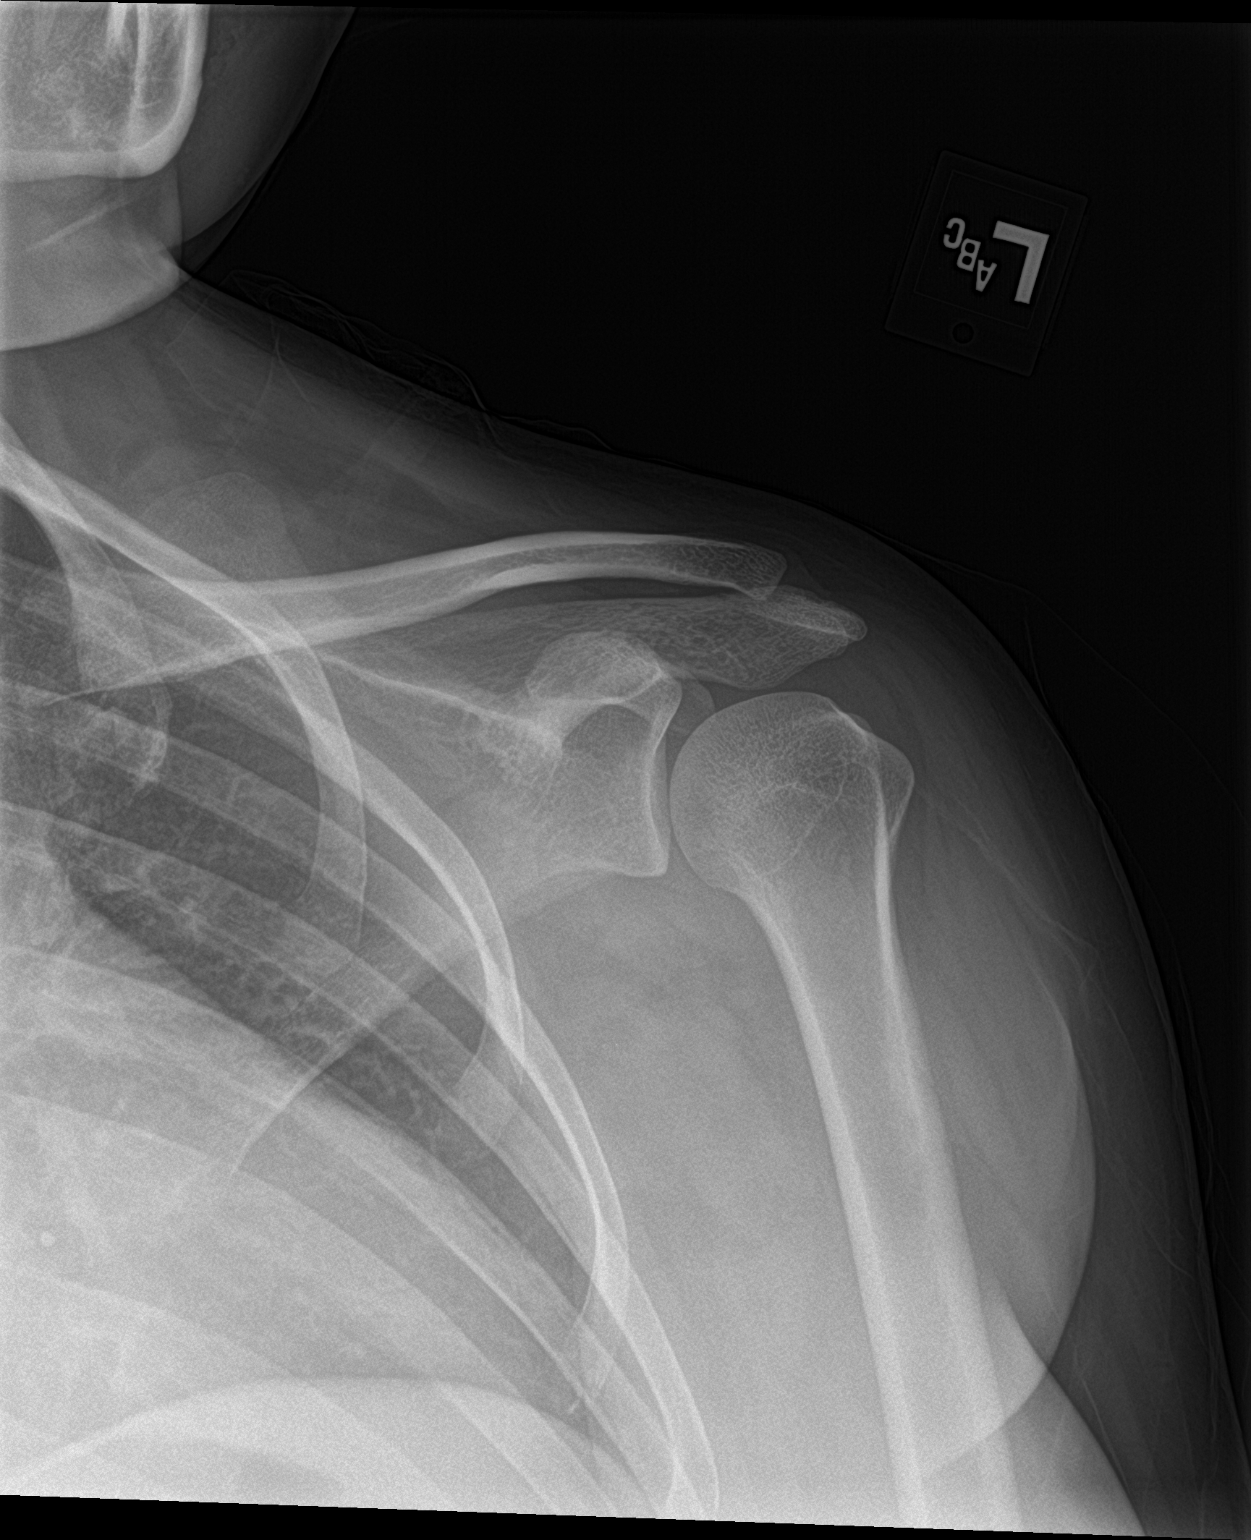

[shoulder y view]
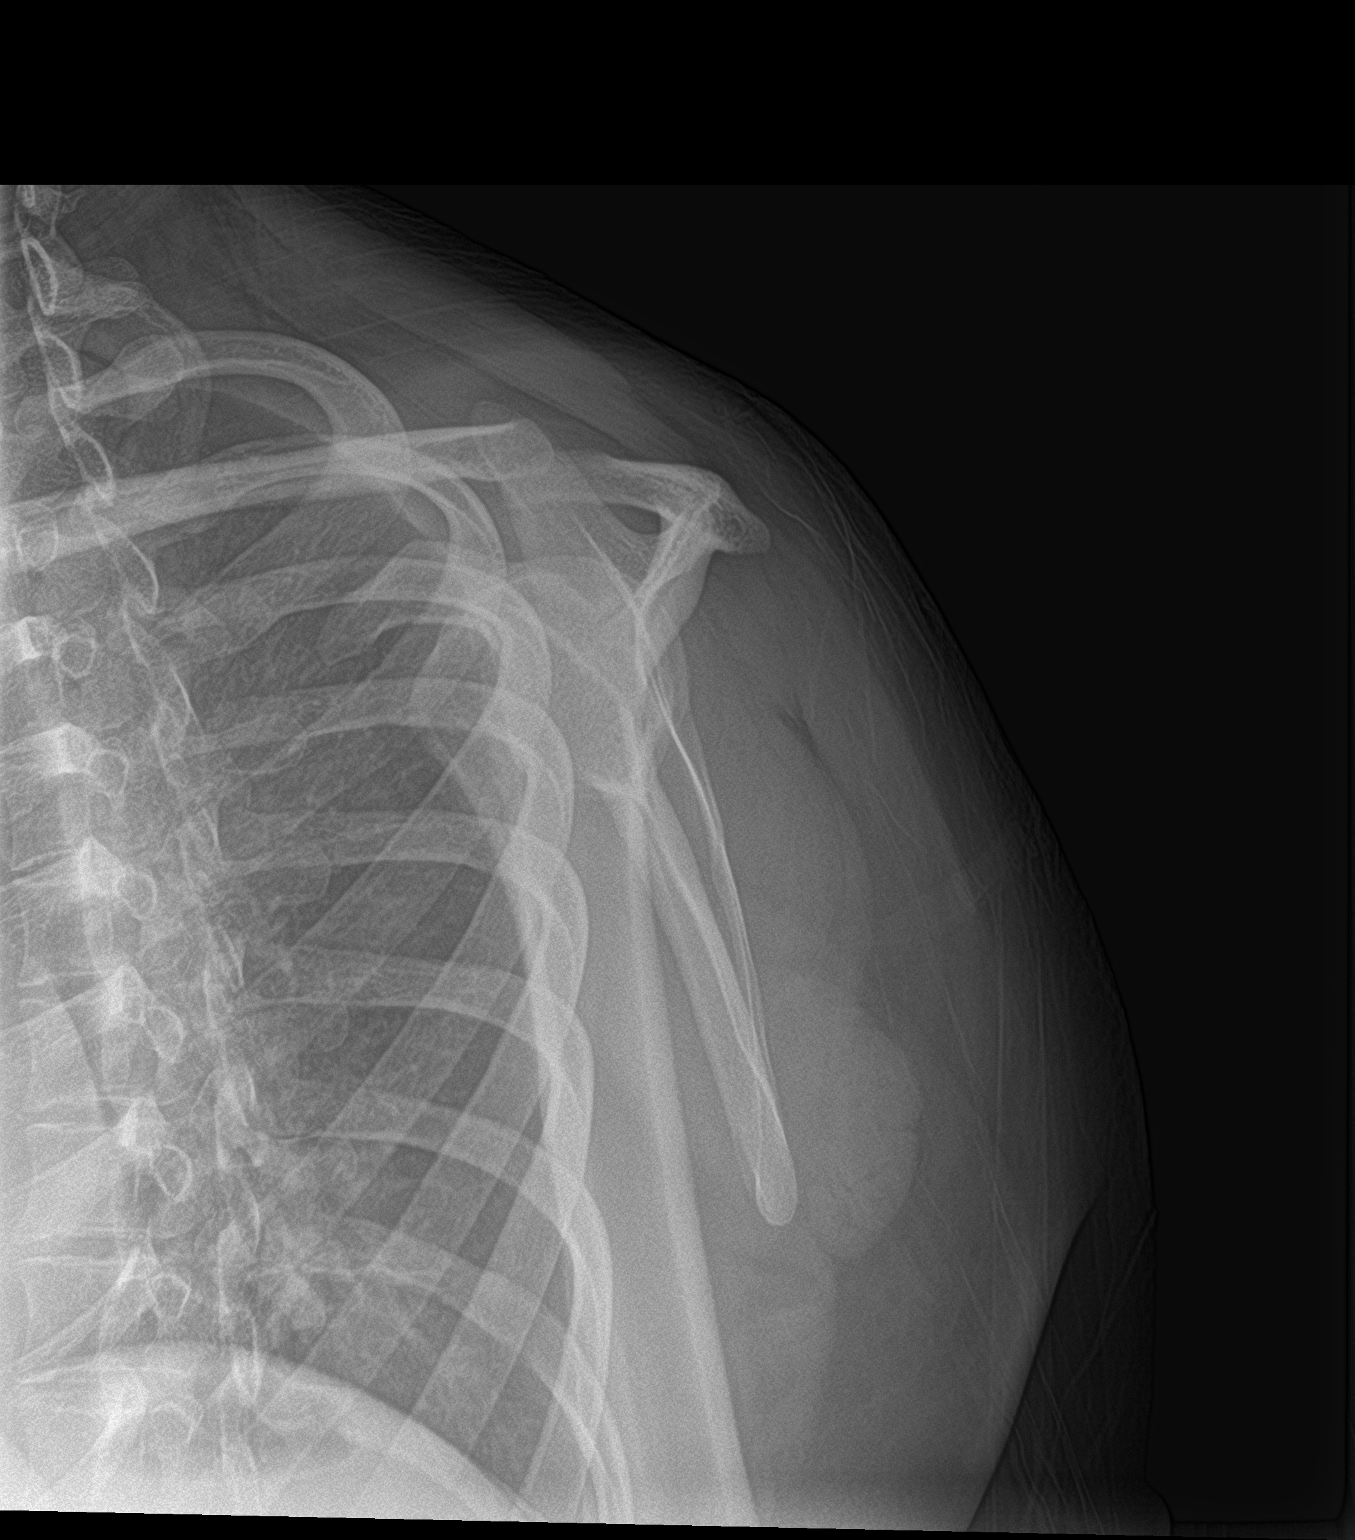

[2 of 2 positions shown; findings below may reference images not displayed]

FINDINGS: There is no evidence of fracture or dislocation. There is no
evidence of arthropathy or other focal bone abnormality. Soft
tissues are unremarkable.
IMPRESSION: Negative.

## 2021-08-31 ENCOUNTER — Emergency Department: Payer: BC Managed Care – PPO

## 2021-08-31 ENCOUNTER — Emergency Department
Admission: EM | Admit: 2021-08-31 | Discharge: 2021-09-01 | Disposition: A | Discharge status: Home / Self Care | Payer: BC Managed Care – PPO | Attending: Emergency Medicine | Admitting: Emergency Medicine

## 2021-08-31 ENCOUNTER — Other Ambulatory Visit: Payer: Self-pay

## 2021-08-31 DIAGNOSIS — N9489 Other specified conditions associated with female genital organs and menstrual cycle: Secondary | ICD-10-CM | POA: Diagnosis not present

## 2021-08-31 DIAGNOSIS — Z3A01 Less than 8 weeks gestation of pregnancy: Secondary | ICD-10-CM | POA: Insufficient documentation

## 2021-08-31 DIAGNOSIS — O039 Complete or unspecified spontaneous abortion without complication: Secondary | ICD-10-CM | POA: Insufficient documentation

## 2021-08-31 DIAGNOSIS — O209 Hemorrhage in early pregnancy, unspecified: Secondary | ICD-10-CM | POA: Diagnosis present

## 2021-08-31 DIAGNOSIS — O4690 Antepartum hemorrhage, unspecified, unspecified trimester: Secondary | ICD-10-CM

## 2021-08-31 LAB — URINALYSIS, ROUTINE W REFLEX MICROSCOPIC
Bacteria, UA: NONE SEEN
Bilirubin Urine: NEGATIVE
Glucose, UA: >=500 mg/dL — AB
Hgb urine dipstick: NEGATIVE
Ketones, ur: 5 mg/dL — AB
Leukocytes,Ua: NEGATIVE
Nitrite: NEGATIVE
Protein, ur: NEGATIVE mg/dL
Specific Gravity, Urine: 1.013 (ref 1.005–1.030)
pH: 7 (ref 5.0–8.0)

## 2021-08-31 LAB — ABO/RH: ABO/RH(D): O POS

## 2021-08-31 LAB — CBC
HCT: 34.6 % — ABNORMAL LOW (ref 36.0–46.0)
Hemoglobin: 12.1 g/dL (ref 12.0–15.0)
MCH: 30.8 pg (ref 26.0–34.0)
MCHC: 35 g/dL (ref 30.0–36.0)
MCV: 88 fL (ref 80.0–100.0)
Platelets: 277 10*3/uL (ref 150–400)
RBC: 3.93 MIL/uL (ref 3.87–5.11)
RDW: 12 % (ref 11.5–15.5)
WBC: 8.4 10*3/uL (ref 4.0–10.5)
nRBC: 0 % (ref 0.0–0.2)

## 2021-08-31 LAB — COMPREHENSIVE METABOLIC PANEL
ALT: 41 U/L (ref 0–44)
AST: 25 U/L (ref 15–41)
Albumin: 4 g/dL (ref 3.5–5.0)
Alkaline Phosphatase: 84 U/L (ref 38–126)
Anion gap: 10 (ref 5–15)
BUN: 8 mg/dL (ref 6–20)
CO2: 23 mmol/L (ref 22–32)
Calcium: 8.9 mg/dL (ref 8.9–10.3)
Chloride: 101 mmol/L (ref 98–111)
Creatinine, Ser: 0.6 mg/dL (ref 0.44–1.00)
GFR, Estimated: >60 mL/min (ref >60)
Glucose, Bld: 227 mg/dL — ABNORMAL HIGH (ref 70–99)
Potassium: 3.7 mmol/L (ref 3.5–5.1)
Sodium: 134 mmol/L — ABNORMAL LOW (ref 135–145)
Total Bilirubin: 0.6 mg/dL (ref 0.3–1.2)
Total Protein: 7.2 g/dL (ref 6.5–8.1)

## 2021-08-31 LAB — HCG, QUANTITATIVE, PREGNANCY: hCG, Beta Chain, Quant, S: 40649 m[IU]/mL — ABNORMAL HIGH (ref <5)

## 2021-08-31 NOTE — ED Provider Notes (Signed)
[facility] Provider Note  Patient Contact: 11:56 PM (approximate)   History   Vaginal Bleeding   HPI  Tara Patterson is a 26 y.o. female G2 P1 presents to the emergency department with an episode of bright red vaginal bleeding that started today.  Patient reports that she is approximately [redacted] weeks pregnant.  She denies pelvic cramping or low back pain.  She denies recent illness.  She denies rhinorrhea, nasal congestion, nonproductive cough, dysuria, hematuria or increased urinary frequency.  No vomiting at home.  She states that pregnancy has been unremarkable until this point.  She denies chest pain, chest tightness or shortness of breath.      Physical Exam   Triage Vital Signs: ED Triage Vitals  Enc Vitals Group     BP 08/31/21 2011 (!) 145/93     Pulse Rate 08/31/21 2011 (!) 116     Resp 08/31/21 2011 18     Temp 08/31/21 2011 100 F (37.8 C)     Temp Source 08/31/21 2011 Oral     SpO2 08/31/21 2011 96 %     Weight 08/31/21 2013 174 lb (78.9 kg)     Height 08/31/21 2013 5\' 6"  (1.676 m)     Head Circumference --      Peak Flow --      Pain Score 08/31/21 2013 0     Pain Loc --      Pain Edu? --      Excl. in [place]? --     Most recent vital signs: Vitals:   08/31/21 2011  BP: (!) 145/93  Pulse: (!) 116  Resp: 18  Temp: 100 F (37.8 C)  SpO2: 96%     General: Alert and in no acute distress. Eyes:  PERRL. EOMI. Head: No acute traumatic findings ENT:      Ears:       Nose: No congestion/rhinnorhea.      Mouth/Throat: Mucous membranes are moist. Neck: No stridor. No cervical spine tenderness to palpation. Hematological/Lymphatic/Immunilogical: No cervical lymphadenopathy. Cardiovascular:  Good peripheral perfusion Respiratory: Normal respiratory effort without tachypnea or retractions. Lungs CTAB. Good air entry to the bases with no decreased or absent breath sounds. Gastrointestinal: Bowel sounds 4 quadrants. Soft and  nontender to palpation. No guarding or rigidity. No palpable masses. No distention. No CVA tenderness. Musculoskeletal: Full range of motion to all extremities.  Neurologic:  No gross focal neurologic deficits are appreciated.  Skin:   No rash noted Other:   ED Results / Procedures / Treatments   Labs (all labs ordered are listed, but only abnormal results are displayed) Labs Reviewed  CBC - Abnormal; Notable for the following components:      Result Value   HCT 34.6 (*)    All other components within normal limits  COMPREHENSIVE METABOLIC PANEL - Abnormal; Notable for the following components:   Sodium 134 (*)    Glucose, Bld 227 (*)    All other components within normal limits  HCG, QUANTITATIVE, PREGNANCY - Abnormal; Notable for the following components:   hCG, Beta Chain, Quant, S 40,649 (*)    All other components within normal limits  URINALYSIS, ROUTINE W REFLEX MICROSCOPIC - Abnormal; Notable for the following components:   Color, Urine YELLOW (*)    APPearance CLEAR (*)    Glucose, UA >=500 (*)    Ketones, ur 5 (*)    All other components within normal limits  POC URINE PREG, ED  ABO/RH  RADIOLOGY  I personally viewed and evaluated these images as part of my medical decision making, as well as reviewing the written report by the radiologist.  ED Provider Interpretation: I personally reviewed OB ultrasound.  Patient has a single IUP with no cardiac activity.   PROCEDURES:  Critical Care performed: No  Procedures   MEDICATIONS ORDERED IN ED: Medications - No data to display   IMPRESSION / MDM / [place] / ED COURSE  I reviewed the triage vital signs and the nursing notes.                              Differential diagnosis includes, but is not limited to, miscarriage, first trimester vaginal bleeding, subchorionic hematoma, UTI.Marland Kitchen  Assessment and plan Vaginal bleeding in pregnancy 26 year old female presents to the emergency  department with acute vaginal bleeding in pregnancy that started tonight.  Had mild tachycardia at triage but vital signs were otherwise reassuring.  Question patient about low-grade fever patient states that she has not been running fever at home as not had any chills or other recent illness.  CBC and CMP were reassuring.  ABO is O+.  Beta hCG appropriately elevated at 40,649.  Dedicated OB ultrasound showed intrauterine pregnancy with no cardiac activity.  Patient has an OB/GYN and I recommended scheduling an appointment.  Counseled patient on expected measures over the next 7 to 10 days.  Recommended patient seeking care with her OB/GYN if she does not pass products of conception in the next 1 to 2 weeks.  She voiced understanding regarding these recommendations.      FINAL CLINICAL IMPRESSION(S) / ED DIAGNOSES   Final diagnoses:  Miscarriage     Rx / DC Orders   ED Discharge Orders     None        Note:  This document was prepared using Dragon voice recognition software and may include unintentional dictation errors.   Vallarie Mare [place], PA-C 09/01/21 0000    Nena Polio, MD 09/01/21 2222

## 2021-08-31 NOTE — Discharge Instructions (Signed)
Make follow-up appointment with your OB/GYN. You can take Tylenol for discomfort. Please expect pelvic cramping and passage of clots over the next 7 to 10 days.

## 2021-08-31 NOTE — ED Triage Notes (Signed)
Pt presents to ER c/o vaginal bleeding that started appx 1hr ago.  Pt states her bleeding has been light and spotting in nature. Denies blood clots or heavy bleeding.  Pt states she receives her prenatal care at [facility].  Pt is a G2P1.  Pt A&O x4 at this time in NAD.

## 2021-12-25 ENCOUNTER — Emergency Department
Admission: EM | Admit: 2021-12-25 | Discharge: 2021-12-25 | Disposition: A | Discharge status: Home / Self Care | Payer: BC Managed Care – PPO | Attending: Emergency Medicine | Admitting: Emergency Medicine

## 2021-12-25 ENCOUNTER — Emergency Department: Payer: BC Managed Care – PPO

## 2021-12-25 ENCOUNTER — Other Ambulatory Visit: Payer: Self-pay

## 2021-12-25 ENCOUNTER — Encounter: Payer: Self-pay | Admitting: Emergency Medicine

## 2021-12-25 DIAGNOSIS — R7309 Other abnormal glucose: Secondary | ICD-10-CM | POA: Diagnosis not present

## 2021-12-25 DIAGNOSIS — Z3A01 Less than 8 weeks gestation of pregnancy: Secondary | ICD-10-CM | POA: Insufficient documentation

## 2021-12-25 DIAGNOSIS — R8289 Other abnormal findings on cytological and histological examination of urine: Secondary | ICD-10-CM | POA: Insufficient documentation

## 2021-12-25 DIAGNOSIS — O99281 Endocrine, nutritional and metabolic diseases complicating pregnancy, first trimester: Secondary | ICD-10-CM | POA: Diagnosis not present

## 2021-12-25 DIAGNOSIS — N939 Abnormal uterine and vaginal bleeding, unspecified: Secondary | ICD-10-CM

## 2021-12-25 DIAGNOSIS — O209 Hemorrhage in early pregnancy, unspecified: Secondary | ICD-10-CM | POA: Insufficient documentation

## 2021-12-25 DIAGNOSIS — E876 Hypokalemia: Secondary | ICD-10-CM | POA: Insufficient documentation

## 2021-12-25 DIAGNOSIS — Z6740 Type O blood, Rh positive: Secondary | ICD-10-CM | POA: Diagnosis not present

## 2021-12-25 DIAGNOSIS — O4690 Antepartum hemorrhage, unspecified, unspecified trimester: Secondary | ICD-10-CM

## 2021-12-25 LAB — CHLAMYDIA/NGC RT PCR (ARMC ONLY)
Chlamydia Tr: NOT DETECTED
N gonorrhoeae: NOT DETECTED

## 2021-12-25 LAB — COMPREHENSIVE METABOLIC PANEL
ALT: 41 U/L (ref 0–44)
AST: 28 U/L (ref 15–41)
Albumin: 4.1 g/dL (ref 3.5–5.0)
Alkaline Phosphatase: 84 U/L (ref 38–126)
Anion gap: 9 (ref 5–15)
BUN: 13 mg/dL (ref 6–20)
CO2: 24 mmol/L (ref 22–32)
Calcium: 9 mg/dL (ref 8.9–10.3)
Chloride: 103 mmol/L (ref 98–111)
Creatinine, Ser: 0.59 mg/dL (ref 0.44–1.00)
GFR, Estimated: >60 mL/min (ref >60)
Glucose, Bld: 221 mg/dL — ABNORMAL HIGH (ref 70–99)
Potassium: 3.3 mmol/L — ABNORMAL LOW (ref 3.5–5.1)
Sodium: 136 mmol/L (ref 135–145)
Total Bilirubin: 0.9 mg/dL (ref 0.3–1.2)
Total Protein: 7.7 g/dL (ref 6.5–8.1)

## 2021-12-25 LAB — CBC
HCT: 37.8 % (ref 36.0–46.0)
Hemoglobin: 12.9 g/dL (ref 12.0–15.0)
MCH: 30.4 pg (ref 26.0–34.0)
MCHC: 34.1 g/dL (ref 30.0–36.0)
MCV: 89.2 fL (ref 80.0–100.0)
Platelets: 297 10*3/uL (ref 150–400)
RBC: 4.24 MIL/uL (ref 3.87–5.11)
RDW: 11.8 % (ref 11.5–15.5)
WBC: 5.9 10*3/uL (ref 4.0–10.5)
nRBC: 0 % (ref 0.0–0.2)

## 2021-12-25 LAB — WET PREP, GENITAL
Clue Cells Wet Prep HPF POC: NONE SEEN
Sperm: NONE SEEN
Trich, Wet Prep: NONE SEEN
WBC, Wet Prep HPF POC: <10 (ref <10)
Yeast Wet Prep HPF POC: NONE SEEN

## 2021-12-25 LAB — URINALYSIS, ROUTINE W REFLEX MICROSCOPIC
Bilirubin Urine: NEGATIVE
Glucose, UA: >=500 mg/dL — AB
Ketones, ur: 5 mg/dL — AB
Leukocytes,Ua: NEGATIVE
Nitrite: NEGATIVE
Protein, ur: NEGATIVE mg/dL
Specific Gravity, Urine: 1.028 (ref 1.005–1.030)
pH: 5 (ref 5.0–8.0)

## 2021-12-25 LAB — ABO/RH: ABO/RH(D): O POS

## 2021-12-25 LAB — POC URINE PREG, ED: Preg Test, Ur: POSITIVE — AB

## 2021-12-25 LAB — HCG, QUANTITATIVE, PREGNANCY: hCG, Beta Chain, Quant, S: 462 m[IU]/mL — ABNORMAL HIGH (ref <5)

## 2021-12-25 NOTE — Discharge Instructions (Signed)
Follow-up on Friday with your OB/GYN team but if you develop worsening symptoms, pain on 1 side, severe vaginal bleeding greater than a pad an hour, or any other concerns then please return to the ER for repeat evaluation   IMPRESSION: Small, 4 mm intrauterine fluid collection, indeterminate but potentially an early gestational sac. Recommend follow-up quantitative B-HCG levels and follow-up US in 14 days to assess viability. This recommendation follows SRU consensus guidelines: Diagnostic Criteria for Nonviable Pregnancy Early in the First Trimester. Malva Limes Med 2013; 707:8675-44.

## 2021-12-25 NOTE — ED Provider Notes (Signed)
[facility] Provider Note    Event Date/Time   First MD Initiated Contact with Patient 12/25/21 1027     (approximate)   History   Vaginal Bleeding   HPI  Tara Patterson is a 26 y.o. female G3, P1 with history of pregnancy who comes in with bleeding.  Patient reports having 1 prior miscarriage back in February and 1 child that is 76 years old.  She reports that she is not sure when her last period was.  She thinks it could have been in April.  She reports having some vaginal spotting that started today.  She denies any pain just a little bit of cramping.  She denies any vaginal discharge or concern for STDs.  Denies any urinary symptoms.     Physical Exam   Triage Vital Signs: ED Triage Vitals  Enc Vitals Group     BP 12/25/21 0904 (!) 145/90     Pulse Rate 12/25/21 0904 86     Resp 12/25/21 0904 18     Temp 12/25/21 0904 98.3 F (36.8 C)     Temp Source 12/25/21 0904 Oral     SpO2 12/25/21 0904 97 %     Weight 12/25/21 0904 174 lb (78.9 kg)     Height 12/25/21 0904 5\' 6"  (1.676 m)     Head Circumference --      Peak Flow --      Pain Score 12/25/21 0908 5     Pain Loc --      Pain Edu? --      Excl. in [place]? --     Most recent vital signs: Vitals:   12/25/21 0904  BP: (!) 145/90  Pulse: 86  Resp: 18  Temp: 98.3 F (36.8 C)  SpO2: 97%     General: Awake, no distress.  CV:  Good peripheral perfusion.  Resp:  Normal effort.  Abd:  No distention. Soft and non tender Other:     ED Results / Procedures / Treatments   Labs (all labs ordered are listed, but only abnormal results are displayed) Labs Reviewed  COMPREHENSIVE METABOLIC PANEL - Abnormal; Notable for the following components:      Result Value   Potassium 3.3 (*)    Glucose, Bld 221 (*)    All other components within normal limits  URINALYSIS, ROUTINE W REFLEX MICROSCOPIC - Abnormal; Notable for the following components:   Color, Urine YELLOW (*)     APPearance HAZY (*)    Glucose, UA >=500 (*)    Hgb urine dipstick LARGE (*)    Ketones, ur 5 (*)    Bacteria, UA RARE (*)    All other components within normal limits  POC URINE PREG, ED - Abnormal; Notable for the following components:   Preg Test, Ur POSITIVE (*)    All other components within normal limits  CBC  HCG, QUANTITATIVE, PREGNANCY  ABO/RH     RADIOLOGY I have reviewed the ultrasound personally and interpreted it small intrauterine fluid collection   PROCEDURES:  Critical Care performed: No  Procedures   MEDICATIONS ORDERED IN ED: Medications - No data to display   IMPRESSION / MDM / ASSESSMENT AND PLAN / ED COURSE  I reviewed the triage vital signs and the nursing notes.   Patient's presentation is most consistent with acute presentation with potential threat to life or bodily function.   Differential includes ectopic, pregnancy, miscarriage.  CBC no anemia.  Pregnancy test positive.  The CMP shows slightly elevated glucose potassium of 3.3.  Patient is O+.  Patient has some WBCs in urine but see a lot of squamous cells so I do not feel that this represents UTI.  IMPRESSION: Small, 4 mm intrauterine fluid collection, indeterminate but potentially an early gestational sac. Recommend follow-up quantitative B-HCG levels and follow-up US in 14 days to assess viability. This recommendation follows SRU consensus guidelines: Diagnostic Criteria for Nonviable Pregnancy Early in the First Trimester. Malva Limes Med 2013; 947:6546-50.    Given her hCG was only 400 this could just be early pregnancy but given technically a pregnancy of uncertain location I did discuss with Donato Schultz given she is going to follow up with OB on Friday at [facility] [place] [facility].  They were okay with patient waiting till Friday for follow-up given lower suspicion for ectopic.  We did discuss return precautions for ectopic pregnancy with patient and she expressed understanding felt comfortable  returning if symptoms are worsening.  I reviewed her [facility] clinic note from 05/09/2020 where she was seen for rash.  Considered admission but given hemoglobin is stable no significant bleeding and no evidence of ectopic patient safe for discharge home     FINAL CLINICAL IMPRESSION(S) / ED DIAGNOSES   Final diagnoses:  Vaginal bleeding  Vaginal bleeding in pregnancy     Rx / DC Orders   ED Discharge Orders     None        Note:  This document was prepared using Dragon voice recognition software and may include unintentional dictation errors.   Concha Se, MD 12/25/21 1243

## 2021-12-25 NOTE — ED Triage Notes (Signed)
Pt in with co vaginal bleeding pt is pregnant but unsure of gestation. Last menses in April.
